# Patient Record
Sex: Male | Born: 1935
Health system: Southern US, Community
[De-identification: ages and names within clinical notes are randomized; demographics above are authoritative.]

## PROBLEM LIST (undated history)

## (undated) DIAGNOSIS — N189 Chronic kidney disease, unspecified: Secondary | ICD-10-CM

## (undated) DIAGNOSIS — I2510 Atherosclerotic heart disease of native coronary artery without angina pectoris: Secondary | ICD-10-CM

## (undated) DIAGNOSIS — J449 Chronic obstructive pulmonary disease, unspecified: Secondary | ICD-10-CM

## (undated) DIAGNOSIS — E7800 Pure hypercholesterolemia, unspecified: Secondary | ICD-10-CM

## (undated) DIAGNOSIS — I10 Essential (primary) hypertension: Secondary | ICD-10-CM

## (undated) HISTORY — DX: Pure hypercholesterolemia, unspecified: E78.00

## (undated) HISTORY — PX: CARDIAC CATHETERIZATION: SHX172

## (undated) HISTORY — DX: Atherosclerotic heart disease of native coronary artery without angina pectoris: I25.10

## (undated) HISTORY — DX: Chronic kidney disease, unspecified: N18.9

## (undated) HISTORY — DX: Chronic obstructive pulmonary disease, unspecified: J44.9

## (undated) HISTORY — DX: Essential (primary) hypertension: I10

---

## 2004-06-06 ENCOUNTER — Encounter (INDEPENDENT_AMBULATORY_CARE_PROVIDER_SITE_OTHER): Payer: Self-pay | Admitting: Family Medicine

## 2004-12-06 ENCOUNTER — Ambulatory Visit: Payer: Self-pay | Admitting: *Deleted

## 2004-12-25 ENCOUNTER — Ambulatory Visit: Payer: Self-pay | Admitting: Family Medicine

## 2004-12-29 ENCOUNTER — Ambulatory Visit (HOSPITAL_COMMUNITY): Admission: RE | Admit: 2004-12-29 | Discharge: 2004-12-29 | Payer: Self-pay | Admitting: Family Medicine

## 2005-01-02 ENCOUNTER — Ambulatory Visit: Payer: Self-pay | Admitting: Family Medicine

## 2005-01-03 ENCOUNTER — Ambulatory Visit: Payer: Self-pay | Admitting: *Deleted

## 2005-01-09 ENCOUNTER — Encounter (INDEPENDENT_AMBULATORY_CARE_PROVIDER_SITE_OTHER): Payer: Self-pay | Admitting: Family Medicine

## 2005-01-09 ENCOUNTER — Ambulatory Visit (HOSPITAL_COMMUNITY): Admission: RE | Admit: 2005-01-09 | Discharge: 2005-01-09 | Payer: Self-pay | Admitting: Family Medicine

## 2005-02-02 ENCOUNTER — Ambulatory Visit: Payer: Self-pay | Admitting: Family Medicine

## 2005-06-25 ENCOUNTER — Ambulatory Visit: Payer: Self-pay | Admitting: Family Medicine

## 2005-08-15 ENCOUNTER — Ambulatory Visit: Payer: Self-pay | Admitting: Family Medicine

## 2005-08-16 ENCOUNTER — Encounter (INDEPENDENT_AMBULATORY_CARE_PROVIDER_SITE_OTHER): Payer: Self-pay | Admitting: Family Medicine

## 2005-09-12 ENCOUNTER — Ambulatory Visit: Payer: Self-pay | Admitting: Family Medicine

## 2005-09-19 ENCOUNTER — Ambulatory Visit: Payer: Self-pay | Admitting: Family Medicine

## 2005-09-21 ENCOUNTER — Inpatient Hospital Stay (HOSPITAL_COMMUNITY): Admission: EM | Admit: 2005-09-21 | Discharge: 2005-09-27 | Payer: Self-pay | Admitting: Emergency Medicine

## 2005-10-04 ENCOUNTER — Ambulatory Visit: Payer: Self-pay | Admitting: Family Medicine

## 2005-10-08 ENCOUNTER — Ambulatory Visit: Payer: Self-pay | Admitting: Family Medicine

## 2005-10-17 ENCOUNTER — Ambulatory Visit: Payer: Self-pay | Admitting: Family Medicine

## 2005-11-14 ENCOUNTER — Ambulatory Visit: Payer: Self-pay | Admitting: Family Medicine

## 2005-12-25 ENCOUNTER — Ambulatory Visit: Payer: Self-pay | Admitting: Family Medicine

## 2005-12-25 LAB — CONVERTED CEMR LAB
RBC count: 5 10*6/uL
WBC, blood: 7.7 10*3/uL

## 2005-12-27 ENCOUNTER — Ambulatory Visit: Payer: Self-pay | Admitting: Family Medicine

## 2006-01-01 ENCOUNTER — Encounter (INDEPENDENT_AMBULATORY_CARE_PROVIDER_SITE_OTHER): Payer: Self-pay | Admitting: Family Medicine

## 2006-01-01 LAB — CONVERTED CEMR LAB: Blood Glucose, Fasting: 99 mg/dL

## 2006-01-04 ENCOUNTER — Encounter (INDEPENDENT_AMBULATORY_CARE_PROVIDER_SITE_OTHER): Payer: Self-pay | Admitting: Family Medicine

## 2006-01-16 ENCOUNTER — Ambulatory Visit: Payer: Self-pay | Admitting: Family Medicine

## 2006-02-01 ENCOUNTER — Encounter: Payer: Self-pay | Admitting: Family Medicine

## 2006-02-01 DIAGNOSIS — M129 Arthropathy, unspecified: Secondary | ICD-10-CM | POA: Insufficient documentation

## 2006-02-01 DIAGNOSIS — F17200 Nicotine dependence, unspecified, uncomplicated: Secondary | ICD-10-CM | POA: Insufficient documentation

## 2006-02-01 DIAGNOSIS — J309 Allergic rhinitis, unspecified: Secondary | ICD-10-CM | POA: Insufficient documentation

## 2006-02-01 DIAGNOSIS — K219 Gastro-esophageal reflux disease without esophagitis: Secondary | ICD-10-CM | POA: Insufficient documentation

## 2006-02-01 DIAGNOSIS — J4489 Other specified chronic obstructive pulmonary disease: Secondary | ICD-10-CM | POA: Insufficient documentation

## 2006-02-01 DIAGNOSIS — J449 Chronic obstructive pulmonary disease, unspecified: Secondary | ICD-10-CM | POA: Insufficient documentation

## 2006-02-01 DIAGNOSIS — H269 Unspecified cataract: Secondary | ICD-10-CM | POA: Insufficient documentation

## 2006-02-01 DIAGNOSIS — E785 Hyperlipidemia, unspecified: Secondary | ICD-10-CM | POA: Insufficient documentation

## 2006-02-01 DIAGNOSIS — I2510 Atherosclerotic heart disease of native coronary artery without angina pectoris: Secondary | ICD-10-CM | POA: Insufficient documentation

## 2006-02-01 DIAGNOSIS — I252 Old myocardial infarction: Secondary | ICD-10-CM | POA: Insufficient documentation

## 2006-02-01 DIAGNOSIS — I10 Essential (primary) hypertension: Secondary | ICD-10-CM | POA: Insufficient documentation

## 2006-02-13 ENCOUNTER — Ambulatory Visit: Payer: Self-pay | Admitting: Family Medicine

## 2006-05-11 ENCOUNTER — Encounter (INDEPENDENT_AMBULATORY_CARE_PROVIDER_SITE_OTHER): Payer: Self-pay | Admitting: Family Medicine

## 2006-05-15 ENCOUNTER — Ambulatory Visit: Payer: Self-pay | Admitting: Family Medicine

## 2006-05-15 ENCOUNTER — Ambulatory Visit (HOSPITAL_COMMUNITY): Admission: RE | Admit: 2006-05-15 | Discharge: 2006-05-15 | Payer: Self-pay | Admitting: Family Medicine

## 2006-05-15 LAB — CONVERTED CEMR LAB
Glucose, Urine, Semiquant: NEGATIVE
Hemoglobin: 14.9 g/dL
Ketones, urine, test strip: NEGATIVE
Nitrite: NEGATIVE
Specific Gravity, Urine: 1.02
Urobilinogen, UA: 1

## 2006-05-16 LAB — CONVERTED CEMR LAB
AST: 14 units/L (ref 0–37)
Albumin: 4.5 g/dL (ref 3.5–5.2)
BUN: 23 mg/dL (ref 6–23)
Basophils Absolute: 0 10*3/uL (ref 0.0–0.1)
Calcium: 9.2 mg/dL (ref 8.4–10.5)
Eosinophils Absolute: 0.2 10*3/uL (ref 0.0–0.7)
Eosinophils Relative: 2 % (ref 0–5)
Glucose, Bld: 128 mg/dL — ABNORMAL HIGH (ref 70–99)
HCT: 45 % (ref 39.0–52.0)
Lymphocytes Relative: 23 % (ref 12–46)
Lymphs Abs: 1.8 10*3/uL (ref 0.7–3.3)
MCHC: 33.8 g/dL (ref 30.0–36.0)
Monocytes Relative: 10 % (ref 3–11)
Platelets: 187 10*3/uL (ref 150–400)
RBC: 5.08 M/uL (ref 4.22–5.81)
TSH: 2.383 microintl units/mL (ref 0.350–5.50)
Total Protein: 6.8 g/dL (ref 6.0–8.3)
WBC: 7.5 10*3/uL (ref 4.0–10.5)

## 2006-05-17 ENCOUNTER — Telehealth (INDEPENDENT_AMBULATORY_CARE_PROVIDER_SITE_OTHER): Payer: Self-pay | Admitting: Family Medicine

## 2006-05-30 ENCOUNTER — Encounter (INDEPENDENT_AMBULATORY_CARE_PROVIDER_SITE_OTHER): Payer: Self-pay | Admitting: Family Medicine

## 2006-06-10 ENCOUNTER — Encounter (INDEPENDENT_AMBULATORY_CARE_PROVIDER_SITE_OTHER): Payer: Self-pay | Admitting: Family Medicine

## 2006-06-24 ENCOUNTER — Encounter (INDEPENDENT_AMBULATORY_CARE_PROVIDER_SITE_OTHER): Payer: Self-pay | Admitting: Family Medicine

## 2006-07-10 ENCOUNTER — Ambulatory Visit: Payer: Self-pay | Admitting: Family Medicine

## 2006-07-10 LAB — CONVERTED CEMR LAB
Cholesterol, target level: 200 mg/dL
HDL goal, serum: 40 mg/dL

## 2006-08-08 ENCOUNTER — Telehealth (INDEPENDENT_AMBULATORY_CARE_PROVIDER_SITE_OTHER): Payer: Self-pay | Admitting: Family Medicine

## 2006-08-12 ENCOUNTER — Encounter (INDEPENDENT_AMBULATORY_CARE_PROVIDER_SITE_OTHER): Payer: Self-pay | Admitting: Family Medicine

## 2006-09-10 ENCOUNTER — Encounter (INDEPENDENT_AMBULATORY_CARE_PROVIDER_SITE_OTHER): Payer: Self-pay | Admitting: Family Medicine

## 2006-10-09 ENCOUNTER — Telehealth (INDEPENDENT_AMBULATORY_CARE_PROVIDER_SITE_OTHER): Payer: Self-pay | Admitting: *Deleted

## 2006-10-09 ENCOUNTER — Ambulatory Visit: Payer: Self-pay | Admitting: Family Medicine

## 2006-10-21 ENCOUNTER — Encounter (INDEPENDENT_AMBULATORY_CARE_PROVIDER_SITE_OTHER): Payer: Self-pay | Admitting: Family Medicine

## 2006-10-22 LAB — CONVERTED CEMR LAB
Cholesterol: 120 mg/dL (ref 0–200)
LDL Cholesterol: 73 mg/dL (ref 0–99)
Total CHOL/HDL Ratio: 3.5
VLDL: 13 mg/dL (ref 0–40)

## 2006-10-23 ENCOUNTER — Ambulatory Visit: Payer: Self-pay | Admitting: Cardiovascular Disease

## 2006-10-23 ENCOUNTER — Encounter (INDEPENDENT_AMBULATORY_CARE_PROVIDER_SITE_OTHER): Payer: Self-pay | Admitting: Family Medicine

## 2006-11-20 ENCOUNTER — Ambulatory Visit: Payer: Self-pay | Admitting: Family Medicine

## 2006-11-25 ENCOUNTER — Telehealth (INDEPENDENT_AMBULATORY_CARE_PROVIDER_SITE_OTHER): Payer: Self-pay | Admitting: Family Medicine

## 2007-01-01 ENCOUNTER — Ambulatory Visit: Payer: Self-pay | Admitting: Family Medicine

## 2007-01-16 ENCOUNTER — Encounter (INDEPENDENT_AMBULATORY_CARE_PROVIDER_SITE_OTHER): Payer: Self-pay | Admitting: Family Medicine

## 2007-01-20 ENCOUNTER — Encounter (INDEPENDENT_AMBULATORY_CARE_PROVIDER_SITE_OTHER): Payer: Self-pay | Admitting: Family Medicine

## 2007-01-20 LAB — CONVERTED CEMR LAB
Albumin: 4.4 g/dL (ref 3.5–5.2)
BUN: 13 mg/dL (ref 6–23)
Basophils Absolute: 0 10*3/uL (ref 0.0–0.1)
Basophils Relative: 0 % (ref 0–1)
CO2: 27 meq/L (ref 19–32)
Chloride: 105 meq/L (ref 96–112)
Cholesterol: 116 mg/dL (ref 0–200)
Eosinophils Absolute: 0.2 10*3/uL (ref 0.0–0.7)
Lymphs Abs: 1.7 10*3/uL (ref 0.7–3.3)
MCHC: 33.5 g/dL (ref 30.0–36.0)
MCV: 90.9 fL (ref 78.0–100.0)
Monocytes Absolute: 0.7 10*3/uL (ref 0.2–0.7)
RDW: 13.3 % (ref 11.5–14.0)
Sodium: 143 meq/L (ref 135–145)
TSH: 1.191 microintl units/mL (ref 0.350–5.50)
Total Bilirubin: 1 mg/dL (ref 0.3–1.2)
Total CHOL/HDL Ratio: 3.4
Total Protein: 6.6 g/dL (ref 6.0–8.3)
Triglycerides: 78 mg/dL (ref <150)

## 2007-01-24 ENCOUNTER — Ambulatory Visit: Payer: Self-pay | Admitting: Family Medicine

## 2007-02-06 ENCOUNTER — Encounter (INDEPENDENT_AMBULATORY_CARE_PROVIDER_SITE_OTHER): Payer: Self-pay | Admitting: Family Medicine

## 2007-03-07 ENCOUNTER — Ambulatory Visit: Payer: Self-pay | Admitting: Family Medicine

## 2007-04-03 ENCOUNTER — Encounter: Payer: Self-pay | Admitting: Family Medicine

## 2007-05-12 ENCOUNTER — Ambulatory Visit: Payer: Self-pay | Admitting: Family Medicine

## 2007-08-11 ENCOUNTER — Ambulatory Visit: Payer: Self-pay | Admitting: Family Medicine

## 2007-08-13 ENCOUNTER — Telehealth (INDEPENDENT_AMBULATORY_CARE_PROVIDER_SITE_OTHER): Payer: Self-pay | Admitting: Family Medicine

## 2007-08-15 LAB — CONVERTED CEMR LAB

## 2007-08-20 ENCOUNTER — Encounter (INDEPENDENT_AMBULATORY_CARE_PROVIDER_SITE_OTHER): Payer: Self-pay | Admitting: Family Medicine

## 2007-08-21 LAB — CONVERTED CEMR LAB
AST: 17 units/L (ref 0–37)
Albumin: 3.9 g/dL (ref 3.5–5.2)
Calcium: 8.8 mg/dL (ref 8.4–10.5)
Chloride: 105 meq/L (ref 96–112)
Creatinine, Ser: 1.05 mg/dL (ref 0.40–1.50)
Glucose, Bld: 110 mg/dL — ABNORMAL HIGH (ref 70–99)
HDL: 25 mg/dL — ABNORMAL LOW (ref >39)
Total Bilirubin: 0.4 mg/dL (ref 0.3–1.2)
Total CHOL/HDL Ratio: 3.8

## 2007-08-26 ENCOUNTER — Ambulatory Visit: Payer: Self-pay | Admitting: Family Medicine

## 2007-11-25 ENCOUNTER — Ambulatory Visit: Payer: Self-pay | Admitting: Family Medicine

## 2007-11-27 ENCOUNTER — Encounter (INDEPENDENT_AMBULATORY_CARE_PROVIDER_SITE_OTHER): Payer: Self-pay | Admitting: Family Medicine

## 2007-12-26 ENCOUNTER — Encounter (INDEPENDENT_AMBULATORY_CARE_PROVIDER_SITE_OTHER): Payer: Self-pay | Admitting: Family Medicine

## 2008-02-24 ENCOUNTER — Ambulatory Visit: Payer: Self-pay | Admitting: Family Medicine

## 2008-03-19 ENCOUNTER — Encounter (INDEPENDENT_AMBULATORY_CARE_PROVIDER_SITE_OTHER): Payer: Self-pay | Admitting: Family Medicine

## 2008-03-22 LAB — CONVERTED CEMR LAB
ALT: <8 units/L (ref 0–53)
Basophils Absolute: 0 10*3/uL (ref 0.0–0.1)
CO2: 24 meq/L (ref 19–32)
Calcium: 9.6 mg/dL (ref 8.4–10.5)
Chloride: 104 meq/L (ref 96–112)
Eosinophils Absolute: 0.2 10*3/uL (ref 0.0–0.7)
Glucose, Bld: 125 mg/dL — ABNORMAL HIGH (ref 70–99)
HCT: 44 % (ref 39.0–52.0)
Hemoglobin: 14.8 g/dL (ref 13.0–17.0)
Lymphocytes Relative: 28 % (ref 12–46)
Lymphs Abs: 1.7 10*3/uL (ref 0.7–4.0)
Neutro Abs: 3.4 10*3/uL (ref 1.7–7.7)
RBC: 4.9 M/uL (ref 4.22–5.81)
TSH: 2.391 microintl units/mL (ref 0.350–4.50)
Total CHOL/HDL Ratio: 3.6
Total Protein: 7.1 g/dL (ref 6.0–8.3)
Triglycerides: 79 mg/dL (ref <150)
VLDL: 16 mg/dL (ref 0–40)

## 2008-04-13 ENCOUNTER — Ambulatory Visit: Payer: Self-pay | Admitting: Family Medicine

## 2008-07-13 ENCOUNTER — Ambulatory Visit: Payer: Self-pay | Admitting: Family Medicine

## 2008-07-13 DIAGNOSIS — M79609 Pain in unspecified limb: Secondary | ICD-10-CM | POA: Insufficient documentation

## 2008-07-15 ENCOUNTER — Encounter (INDEPENDENT_AMBULATORY_CARE_PROVIDER_SITE_OTHER): Payer: Self-pay | Admitting: Family Medicine

## 2008-10-12 ENCOUNTER — Ambulatory Visit: Payer: Self-pay | Admitting: Family Medicine

## 2008-10-12 DIAGNOSIS — IMO0002 Reserved for concepts with insufficient information to code with codable children: Secondary | ICD-10-CM | POA: Insufficient documentation

## 2008-10-22 LAB — CONVERTED CEMR LAB
Alkaline Phosphatase: 51 units/L (ref 39–117)
Basophils Relative: 0 % (ref 0–1)
CO2: 24 meq/L (ref 19–32)
Creatinine, Ser: 1.03 mg/dL (ref 0.40–1.50)
Eosinophils Relative: 2 % (ref 0–5)
HCT: 44.1 % (ref 39.0–52.0)
HDL: 47 mg/dL (ref >39)
Hemoglobin: 14.9 g/dL (ref 13.0–17.0)
Lymphocytes Relative: 28 % (ref 12–46)
Lymphs Abs: 2.5 10*3/uL (ref 0.7–4.0)
MCHC: 33.8 g/dL (ref 30.0–36.0)
Monocytes Relative: 12 % (ref 3–12)
Neutro Abs: 5 10*3/uL (ref 1.7–7.7)
Platelets: 186 10*3/uL (ref 150–400)
Potassium: 4.1 meq/L (ref 3.5–5.3)
RBC: 4.78 M/uL (ref 4.22–5.81)
Sodium: 139 meq/L (ref 135–145)
Total Bilirubin: 0.9 mg/dL (ref 0.3–1.2)
Total CHOL/HDL Ratio: 2.9
VLDL: 11 mg/dL (ref 0–40)
WBC: 8.7 10*3/uL (ref 4.0–10.5)

## 2008-11-23 ENCOUNTER — Ambulatory Visit: Payer: Self-pay | Admitting: Family Medicine

## 2010-05-02 NOTE — Letter (Signed)
Summary: rpc chart  rpc chart   Imported By: Curtis Sites 01/12/2010 15:24:44  _____________________________________________________________________  External Attachment:    Type:   Image     Comment:   External Document

## 2010-08-15 NOTE — Assessment & Plan Note (Signed)
[facility]                        CARDIOLOGY OFFICE NOTE   Ashrith, Sagan SI JACHIM                       MRN:          [id number]  DATE:10/23/2006                            DOB:          [birth year]    Mr. Danny Hill was seen at the request of Dr. Erby Pian. He asked our  opinion in regards to his coronary artery disease and previous IMI.   The patient is quite a Editor, commissioning. He had an inferior wall MI in  [place], [place] back in 2006. At that time, he received a bare-metal  stent to the RCA. He had residual 50% circumflex and 75% LAD disease as  well as 50-70% left main disease. Apparently, the patient was suggested  to have bypass surgery. He saw Dr. Dorethea Clan once and apparently did not  follow up. When I talked to the patient, he was still not interested in  any workup and is clearly not interested in open-heart surgery. He says  when I leave this world, I want to leave it one piece. I am not  interested in the blade.   In talking to the patient, he also continues to smoke a few cigarettes a  day. He is not interested in quitting. He does not want any Chantix or  Wellbutrin.   He is compliant with his medications. He walks on a daily basis. He  denies any chest pain. His initial event actually felt more like being  struck by lightening. It was fairly atypical. He said he has never had  chest pain. He gets some exertional dyspnea due to his smoking. His  review of systems is otherwise negative. He has not had significant  cough, sputum production. There has been no PND, orthopnea. No  palpitations or syncope.   As far as I can tell, his EF back in 2006 was in the 45-50% range with  inferior/posterior akinesis.   I reviewed lab work that was done on the patient on October 21, 2006. At  the time, his LDL was 34. His triglycerides were 67. Total cholesterol  was only 120.   The patient had a hematocrit of 45. He had normal LFTs. Platelet  count  was 187. His TSH was 2.3.   His medications currently include:  1. Fish oil.  2. Flax seed oil.  3. Benazepril 40 a day.  4. Simvastatin 80 a day.  5. Carvedilol 6.25 b.i.d.  6. Omeprazole 20 b.i.d.   Exam is remarkable for an elderly white male in no distress. Affect is  appropriate. Weight is 169. Respiratory rate is 14. Blood pressure is  120/80. Pulse is 64 and regular. Affect is appropriate.  HEENT:  Normal. There is no carotid bruits. No lymphadenopathy. No  thyromegaly. No JVP elevation.  LUNGS:  Were clear with good diaphragmatic motion. No wheezing. There is  a S1/S2 with normal heart sounds. PMI is normal.  ABDOMEN:  Benign. Bowel sounds positive. No AAA. No hepatosplenomegaly  or hepatojugular reflux. No tenderness.  Distal pulses were intact. No edema.  NEUROLOGICAL:  Was nonfocal. There was no muscular weakness.   His baseline EKG shows  sinus rhythm with previous inferior wall MI.   IMPRESSION:  1. Coronary disease. Bare-metal stent to the right coronary artery      with residual left main disease. The patient is currently      asymptomatic. I frankly asked him if he would be willing to have a      stress test and a followup heart catheterization if it was      abnormal. He says he prefers to continue with medical therapy, and      if he gets sick, he will let me know. I told him this had seemed to      work for him for the last two years but may not work indefinitely.      However, the patient clearly does not want an aggressive workup at      this time.  2. Hypercholesterolemia. Currently doing well. Continue statin drug as      well as fish oil.  3. Hypertension. Currently well controlled on benazepril. Also useful      given his mildly decreased LV function and previous myocardial      infarction.  4. Reflux. Continue omeprazole 20 b.i.d.  5. Smoking. The patient is not interested in Wellbutrin or Chantix. He      prefers to smoke a few cigarettes a  day. He will call me if he is      going to try any of these aids. He is not a great candidate for      nicotine replacement.   Overall, I think the patient is fortunate to be doing well, and I will  see him back in six months.     Noralyn Pick. Eden Emms, MD, [facility]  Electronically Signed    PCN/MedQ  DD: 10/23/2006  DT: 10/24/2006  Job #: [id number]   cc:   Erby Pian, M.D.

## 2010-08-18 NOTE — Procedures (Signed)
NAME:  Danny Hill, Danny Hill NO.:  [id number]   MEDICAL RECORD NO.:  [id number]          PATIENT TYPE:  OUT   LOCATION:  RESP                          FACILITY:  [facility]   PHYSICIAN:  Edward L. Juanetta Gosling, M.D.DATE OF BIRTH:  11-Jan-1929   DATE OF PROCEDURE:  DATE OF DISCHARGE:                              PULMONARY FUNCTION TEST   1.  Spirometry shows no definite ventilatory defect but evidence of airflow      obstruction.  2.  Lung volumes are normal.  3.  DLCO is minimally reduced.  4.  Arterial blood gases ae normal.  5.  There is no significant bronchodilator effect. This is consistent with      airflow obstruction in the smaller airways.      Edward L. Juanetta Gosling, M.D.  Electronically Signed     ELH/MEDQ  D:  01/10/2005  T:  01/10/2005  Job:  [id number]

## 2010-08-18 NOTE — Group Therapy Note (Signed)
NAME:  Danny Hill, Danny Hill NO.:  [id number]   MEDICAL RECORD NO.:  [id number]          PATIENT TYPE:  INP   LOCATION:  A225                          FACILITY:  [facility]   PHYSICIAN:  Osvaldo Shipper, MD     DATE OF BIRTH:  [birth year]   DATE OF PROCEDURE:  09/23/2005  DATE OF DISCHARGE:                                   PROGRESS NOTE   SUBJECTIVE:  The patient feels much better.  His pain is much improved.  He  thinks his left leg cellulitis is improving, as well.  He denies any chest  pain or shortness of breath, otherwise is quite comfortable.   OBJECTIVE:  VITAL SIGNS:  Temperature currently 99.1.  His T-max was 100.3  earlier today around midnight.  Blood pressure 107/70, heart rate in the  70s, respiratory rate about 16.  Saturations are not recorded.  LUNGS:  Clear to auscultation bilaterally.  CARDIOVASCULAR:  S1 and S2 normal and regular.  ABDOMEN:  Soft, nontender, nondistended.  Bowel sounds present.  No mass or  organomegaly appreciated.  EXTREMITIES:  The left leg shows slight improvement in the erythema compared  to when I examined this patient at the time of admission.  His left leg does  feel more tense compared to that time but it was somewhat tense yesterday,  as well.  He does have good capillary refill on that side.  He can move his  foot and toes without any difficulty.  According to his girlfriend, his  lesion has improved.  There is no crepitus.  It is still warm to touch,  still quite erythematous.   LABORATORY DATA:  White count today 13, improved from 14.8 yesterday.  His  hemoglobin is 11.7.  MCV 90, platelet count 194, 84% neutrophils.  ESR 40  two to three days ago.  Sodium 133, improved; potassium 3.5, stable;  chloride 106; glucose 174; bicarb 22; BUN improved at 17; creatinine  improved to 1.1.  Blood cultures are all negative.   IMAGING STUDIES:  He did have a CT of the lower extremity which showed  extensive edema throughout the entire  left calf with no fluid collection  suggestive of abscess.  Findings are most compatible with cellulitis.  They  said it was difficult to completely exclude fasciitis as the edema extends  down to the muscle; however, no intramuscular edema of calf or other  abnormalities noted.   ASSESSMENT AND PLAN:  1.  Left leg cellulitis.  I think the diagnosis still is pretty much      cellulitis.  There was no DVT that appeared that venous Dopplers.  CT of      the leg did not show any intramuscular process at this time.  He is      currently on Vancomycin and then Levaquin was added two days ago to      cover for gram negatives which I think is a reasonable idea.  His white      count is improving.  His fevers are getting better.  His symptoms, as  well as his findings are getting better.  I think the patient is just      improving very slowly but he is definitely improving.  CT ruled out any      evidence of fasciitis or any other concern requiring surgery.  I think      he will need at least one or two more days of antibiotics intravenously      and then will need to reevaluate him at that point.  2.  History of coronary artery disease, stable.  3.  Hyponatremia is improved.  4.  Acute renal failure is resolved.  5.  Hypertension, well controlled.  6.  Anemia, also somewhat stable.  Monitor H&H.   DISPOSITION:  He think he needs, as mentioned above, one to two more days of  IV antibiotics when, based on his clinical improvement, disposition can be  decided at that point.      Osvaldo Shipper, MD  Electronically Signed     GK/MEDQ  D:  09/23/2005  T:  09/23/2005  Job:  [id number]

## 2010-08-18 NOTE — H&P (Signed)
NAME:  Danny Hill, PANNING NO.:  [id number]   MEDICAL RECORD NO.:  [id number]          PATIENT TYPE:  EMS   LOCATION:  ED                            FACILITY:  [facility]   PHYSICIAN:  Osvaldo Shipper, MD     DATE OF BIRTH:  [birth year]   DATE OF ADMISSION:  09/20/2005  DATE OF DISCHARGE:  LH                                HISTORY & PHYSICAL   PRIMARY CARE PHYSICIAN:  Dr. Franchot Heidelberg.   ADMITTING DIAGNOSES:  1.  Likely left lower extremity cellulitis.  2.  Rule out deep venous thrombosis.  3.  History of coronary artery disease.  4.  History of hypertension.  5.  Likely history of acid reflux disease.   CHIEF COMPLAINT:  Left leg pain for the past 4 days.   HISTORY OF PRESENTING ILLNESS:  Patient is a 77 year old Caucasian male with  a past medical history of coronary artery disease, hypertension, who was  doing quite well until about 4 days ago when he started noticing pain in his  left leg, and then he examined his leg, he noted it was red in color.  He  experienced severe pain with this redness.  Over the course of the next 2  days, the pain got worse.  His p.o. intake decreased.  He started having  nausea and vomiting.  His temperature today was 97 degrees.  Yesterday, he  went to his PMD who suspected some kind of vasculitic process and obtained  autoimmune labs, as well as did a skin biopsy, all of which are pending at  this time.  He was prescribed prednisone Dosepak to manage his pain and his  symptoms.  However, patient did not feel well over the course of yesterday.  He took his prednisone this morning only; did not take any last night.  His  pain got worse and he decided to come into the ED.  He denies any trauma to  his legs.  He does give history of some spots in the back of his knee, which  have been there for about a week.   MEDICATIONS AT HOME:  1.  Prednisone Dosepak, which he took for the first time this morning.  2.  Atenolol 25 mg once a  day.  3.  Plavix 75 mg once a day.  4.  Lipitor 40 mg daily.  5.  Prevacid 30 mg b.i.d.  6.  Aspirin 81 mg daily.  7.  Norvasc 10 mg daily.  8.  Benazepril 20 mg daily.  9.  He also takes fish oil, flaxseed, and took Tylenol on an as-needed      basis.   ALLERGIES:  NO KNOWN DRUG ALLERGIES.   PAST MEDICAL HISTORY:  History of coronary artery disease with an MI over 9  months ago at [place], [place] for which he had a stent placed, but he  does not know any further details.  That was his first heart attack.  No  history of CABG.  History of dyslipidemia, hypertension for the past many  years.  Possible history of GERD.  No  surgeries in the past.   SOCIAL HISTORY:  Lives with [place] with his girlfriend.  He is a retired Ecologist.  Although he has not traveled any long distance over the past few  weeks.  Smokes currently about 1 cigarette every other day.  Has  approximately 55-pack-year history of smoking.  No alcohol use.  No illicit  drug use.  He does not want a nicotine patch at this time.   FAMILY HISTORY:  Significant for coronary artery disease, open heart  surgeries, breast cancer in sister, hypertension in his brother.   REVIEW OF SYSTEMS:  Ten-point review of systems was unremarkable except as  mentioned in the HPI.  The only thing I would like to add probably is his  very poor p.o. intake and his dry mouth over the past few days.   PHYSICAL EXAMINATION:  VITAL SIGNS:  Temperature 98.4, blood pressure  131/67, heart rate is 73, respiratory rate is 18.  Saturation is not  recorded.  GENERAL EXAM:  He is a well-developed, well-nourished individual actually in  mild discomfort but in no distress.  HEENT:  There is no pallor, no icterus.  Oral mucosa is dry, but no oral  lesions are noted.  NECK:  Soft and supple.  No thyromegaly appreciated.  LUNGS:  Clear to auscultation bilaterally.  CARDIOVASCULAR:  S1, S2 is normal, regular.  No murmurs appreciated, though   heart sounds were somewhat distant.  ABDOMEN:  Soft, nontender, nondistended.  Bowel sounds are noted.  No  organomegaly appreciated.  EXTREMITIES:  No extremity edema was noted.  SKIN EXAMINATION:  There is one area of about 2 cm x 1 cm on his lower  abdomen; it is erythematous; it is slightly scaly.  There are 2 similar  lesions in the back of his left knee joint, was also scaly and appeared to  be healing.  He has 1 lesion on the right leg over his shin area, which also  is scaly and scabby.   The left leg appears quite erythematous, almost hovering on being red in  color.  It is warm to touch.  It is tender to palpation all over.  It  extends pretty much about 3 cm below the knee joint, all the way down to  ankle joint and mostly anteriorly but extending also somewhat posteriorly.  There is some calf tenderness also present.  There is no real area of any  discharge.  There are a few petechiae, which are present.  The area of punch  biopsy is noted to be without any discharge, as well.  Peripheral pulses are  palpable.   LABORATORY DATA:  His white count is 14,500 with 87% neutrophils and no  bands reported yet.  Hemoglobin 14.9, MCV 91, platelet count 168.  His  complete metabolic profile shows sodium 161, potassium is 4.0, chloride 95,  bicarb of 27, glucose 158, BUN is 33, creatinine 1.5, calcium 8.6, albumin  is 3.3.  LFTs are otherwise unremarkable.   X-ray of his left tibia-fibula, which did not show any acute abnormality.   IMPRESSION:  This is a 77 year old Caucasian male with coronary artery  disease, hypertension, dyslipidemia, who presents with pain in his left leg.  The overall picture is suggestive of cellulitis.  I discussed with Dr.  Erby Pian as to his evaluation yesterday when he saw him.  He informed that  the leg was not quite as red as I described to him.  It was not warm to  touch and, hence, he was suspecting more vasculitic process.  Definitely it  is a  possible differential.  However, I think for now we are going to treat  this as a cellulitis.  A deep venous thrombosis also needs to be ruled out,  but it is less likely.  Because of his other skin lesions that he has in his  lower abdomen and over his right leg and behind his left knee, I am also  concerned about methicillin-resistant Staphylococcus aureus here.  Hence, I  think vancomycin would be an appropriate antibiotic for now.  His other  medical problems seem stable.   PLAN:  1.  Left leg cellulitis:  We will obtain blood cultures; start him on      vancomycin.  He has been given Ancef in the ED.  Once we obtain results      of the cultures, antibiotic coverage may be changed.  Pain control will      be with Dilaudid.  Tylenol will be given for fevers, if any. Dr.      Erby Pian informed me that the blood work he sent, which include ANA,      ESR, are all pending.  I think I will just check an ESR in this      hospital.  He also did a skin biopsy, results of which are also pending.  2.  Dehydration:  He has elevated BUN and he is quite dry.  He will be given      gentle hydration.  I am not sure of his ejection fraction at this time.      His nausea and vomiting will be controlled with Zofran and Phenergan as      needed.  Hopefully, his dehydration will be corrected with IV fluids.  3.  His coronary artery disease appears to be stable.  Continue aspirin,      Plavix, beta blocker, and ARB.  4.  Hypertension:  Continuing Norvasc, ARB, and beta blockers for his blood      pressure is reasonable at this time.  If the patient starts spiking      fevers and if he starts becoming septic, we may have to hold back on      these antihypertensive agents.  5.  Hyponatremia:  Likely from dehydration, which should correct with IV      fluids.  6.  DVT and GI prophylaxis will be initiated.   Further management and decision will be based on the results of initial  tests and the patient's  response to treatment.      Osvaldo Shipper, MD  Electronically Signed     GK/MEDQ  D:  09/20/2005  T:  09/20/2005  Job:  [id number]   cc:   Franchot Heidelberg, MD

## 2010-08-18 NOTE — Discharge Summary (Signed)
NAME:  Danny Hill, Danny Hill NO.:  [id number]   MEDICAL RECORD NO.:  [id number]          PATIENT TYPE:  INP   LOCATION:  A225                          FACILITY:  [facility]   PHYSICIAN:  Hanley Hays. Dechurch, M.D.DATE OF BIRTH:  08/28/1928   DATE OF ADMISSION:  09/20/2005  DATE OF DISCHARGE:  06/28/2007LH                                 DISCHARGE SUMMARY   DISCHARGE DIAGNOSES:  1.  Cellulitis/erysipelas of left lower extremity.  2.  History of coronary artery disease.  3.  History of hypertension.  4.  Gastroesophageal reflux disease on Prevacid twice a day.  5.  Status post myocardial infarction/stent treated at [facility] on      aspirin and Plavix.  6.  History of dyslipidemia.  7.  Hypertension.  8.  History of tobacco abuse.   DISPOSITION:  The patient is discharged to home to follow up with Dr.  Pryor Montes in 5-7 days.   WOUND CARE:  Home health for dressing changes to his left lower extremity  wound, Bactroban, Vaseline gauze and dry dressing daily.   DISCHARGE MEDICATIONS:  1.  Atenolol 25 mg daily.  2.  Plavix 75 mg daily.  3.  Lipitor 40 mg daily.  4.  Prevacid 30 mg b.i.d.  5.  Aspirin 81 mg daily.  6.  Norvasc 10 daily.  7.  Benazepril 10 mg daily.   ALLERGIES:  No known drug allergies.   HOSPITAL COURSE:  A 77 year old, active gentleman who developed pain and  swelling in his left lower extremity.  He was seen by his primary care  physician who did a punch biopsy and was given a corticosteroid dose pack.  The pain and edema and erythema markedly worsened and he presented to the  emergency room for further evaluation.  Initial labs revealed a leukocytosis  at 14.5 and normal hemoglobin.  BUN was 33 and creatinine 1.5.  The  sedimentation rate was noted at 40.  Blood cultures remained negative.  A  MRSA screen revealed no evidence of MRSA and wound culture was unremarkable.  He was treated initially with vancomycin and Levaquin.  There was some  reduction in the erythema.  He remained with low-grade fevers.  Unasyn was  added to his regimen and over the course of the next 48 hours, his fever  resolved as did his white count return to baseline.  He was transitioned to  oral Augmentin 875 mg XR b.i.d. and he remained stable.  The open area on  the extremity was treated with Silvadene and there was some increased  erythema raising the question of a local antibiotic reaction.  The Silvadene  was discontinued and he was treated with Bactroban.  On the day of  discharge, the foot reveals 2+ very soft edema.  There is no erythema.  There is some granulation tissue on the medial aspect of the calf and some  denuding, but overall the leg is much improved.  He is complaining of a  little pain.  He has had no more fever and he otherwise has been clinically  stable.  White  count at the time of discharge is 9.9.  His hemoglobin is  11.3 with a hematocrit of 33.  He was hypokalemic during the hospital stay,  but this was supplemented and returned to baseline.  His BUN and creatinine  are normal at the time of discharge.  He had no other current events.  He  remains stable from the standpoint of his vascular disease.  He did have an  ultrasound of the lower extremity which revealed no evidence of DVT.  He  does have a left popliteal cyst which was on incidental finding.  CT of the  lower extremity revealed extensive subcutaneous edema throughout the entire  left calf with no focal collections to suggest abscess most compatible with  cellulitis.  There was no evidence of fasciitis.   The patient is discharged to home with home health to assist with wound care  given his spouses poor eye sight and he will follow up with Dr. Pryor Montes in 5-  7 days for re-evaluation.  No other medication changes were made in his  regimen.  He may benefit from Lotrel which will reduce his pill load which  he tends to complain about during the hospital stay.  He could  not give me  any GI history suggestive of gastroesophageal reflux disease and I suspect  he is on the b.i.d. Prevacid for GI protection and this will be deferred to  his primary care physician.  He is discharged to home in stable condition  with the plan as noted above.      Hanley Hays Josefine Class, M.D.  Electronically Signed     FED/MEDQ  D:  09/27/2005  T:  09/27/2005  Job:  [id number]

## 2015-05-11 DIAGNOSIS — I739 Peripheral vascular disease, unspecified: Secondary | ICD-10-CM | POA: Diagnosis not present

## 2015-05-11 DIAGNOSIS — E782 Mixed hyperlipidemia: Secondary | ICD-10-CM | POA: Diagnosis not present

## 2015-05-11 DIAGNOSIS — F17210 Nicotine dependence, cigarettes, uncomplicated: Secondary | ICD-10-CM | POA: Diagnosis not present

## 2015-05-11 DIAGNOSIS — I10 Essential (primary) hypertension: Secondary | ICD-10-CM | POA: Diagnosis not present

## 2015-05-11 DIAGNOSIS — K210 Gastro-esophageal reflux disease with esophagitis: Secondary | ICD-10-CM | POA: Diagnosis not present

## 2015-05-11 DIAGNOSIS — N182 Chronic kidney disease, stage 2 (mild): Secondary | ICD-10-CM | POA: Diagnosis not present

## 2015-05-11 DIAGNOSIS — Z6822 Body mass index (BMI) 22.0-22.9, adult: Secondary | ICD-10-CM | POA: Diagnosis not present

## 2015-07-27 DIAGNOSIS — H35033 Hypertensive retinopathy, bilateral: Secondary | ICD-10-CM | POA: Diagnosis not present

## 2015-11-08 DIAGNOSIS — I10 Essential (primary) hypertension: Secondary | ICD-10-CM | POA: Diagnosis not present

## 2015-11-08 DIAGNOSIS — E782 Mixed hyperlipidemia: Secondary | ICD-10-CM | POA: Diagnosis not present

## 2015-11-08 DIAGNOSIS — K210 Gastro-esophageal reflux disease with esophagitis: Secondary | ICD-10-CM | POA: Diagnosis not present

## 2015-11-08 DIAGNOSIS — N182 Chronic kidney disease, stage 2 (mild): Secondary | ICD-10-CM | POA: Diagnosis not present

## 2015-11-14 DIAGNOSIS — Z6821 Body mass index (BMI) 21.0-21.9, adult: Secondary | ICD-10-CM | POA: Diagnosis not present

## 2015-11-14 DIAGNOSIS — F17210 Nicotine dependence, cigarettes, uncomplicated: Secondary | ICD-10-CM | POA: Diagnosis not present

## 2015-11-14 DIAGNOSIS — K210 Gastro-esophageal reflux disease with esophagitis: Secondary | ICD-10-CM | POA: Diagnosis not present

## 2015-11-14 DIAGNOSIS — I739 Peripheral vascular disease, unspecified: Secondary | ICD-10-CM | POA: Diagnosis not present

## 2015-11-14 DIAGNOSIS — N182 Chronic kidney disease, stage 2 (mild): Secondary | ICD-10-CM | POA: Diagnosis not present

## 2015-11-14 DIAGNOSIS — Z0001 Encounter for general adult medical examination with abnormal findings: Secondary | ICD-10-CM | POA: Diagnosis not present

## 2015-11-14 DIAGNOSIS — I10 Essential (primary) hypertension: Secondary | ICD-10-CM | POA: Diagnosis not present

## 2016-01-24 DIAGNOSIS — Z6821 Body mass index (BMI) 21.0-21.9, adult: Secondary | ICD-10-CM | POA: Diagnosis not present

## 2016-01-24 DIAGNOSIS — C4492 Squamous cell carcinoma of skin, unspecified: Secondary | ICD-10-CM | POA: Diagnosis not present

## 2016-01-31 DIAGNOSIS — Z85828 Personal history of other malignant neoplasm of skin: Secondary | ICD-10-CM | POA: Diagnosis not present

## 2016-01-31 DIAGNOSIS — C44629 Squamous cell carcinoma of skin of left upper limb, including shoulder: Secondary | ICD-10-CM | POA: Diagnosis not present

## 2016-01-31 DIAGNOSIS — D485 Neoplasm of uncertain behavior of skin: Secondary | ICD-10-CM | POA: Diagnosis not present

## 2016-02-09 DIAGNOSIS — C44629 Squamous cell carcinoma of skin of left upper limb, including shoulder: Secondary | ICD-10-CM | POA: Diagnosis not present

## 2016-04-16 DIAGNOSIS — D0439 Carcinoma in situ of skin of other parts of face: Secondary | ICD-10-CM | POA: Diagnosis not present

## 2016-04-16 DIAGNOSIS — D485 Neoplasm of uncertain behavior of skin: Secondary | ICD-10-CM | POA: Diagnosis not present

## 2016-04-16 DIAGNOSIS — Z85828 Personal history of other malignant neoplasm of skin: Secondary | ICD-10-CM | POA: Diagnosis not present

## 2016-04-16 DIAGNOSIS — L570 Actinic keratosis: Secondary | ICD-10-CM | POA: Diagnosis not present

## 2016-04-26 DIAGNOSIS — C44329 Squamous cell carcinoma of skin of other parts of face: Secondary | ICD-10-CM | POA: Diagnosis not present

## 2016-05-23 DIAGNOSIS — F17210 Nicotine dependence, cigarettes, uncomplicated: Secondary | ICD-10-CM | POA: Diagnosis not present

## 2016-05-23 DIAGNOSIS — Z6821 Body mass index (BMI) 21.0-21.9, adult: Secondary | ICD-10-CM | POA: Diagnosis not present

## 2016-05-23 DIAGNOSIS — R5383 Other fatigue: Secondary | ICD-10-CM | POA: Diagnosis not present

## 2016-05-23 DIAGNOSIS — I739 Peripheral vascular disease, unspecified: Secondary | ICD-10-CM | POA: Diagnosis not present

## 2016-05-23 DIAGNOSIS — I10 Essential (primary) hypertension: Secondary | ICD-10-CM | POA: Diagnosis not present

## 2016-06-06 DIAGNOSIS — N182 Chronic kidney disease, stage 2 (mild): Secondary | ICD-10-CM | POA: Diagnosis not present

## 2016-06-06 DIAGNOSIS — I739 Peripheral vascular disease, unspecified: Secondary | ICD-10-CM | POA: Diagnosis not present

## 2016-06-06 DIAGNOSIS — I10 Essential (primary) hypertension: Secondary | ICD-10-CM | POA: Diagnosis not present

## 2016-06-06 DIAGNOSIS — E782 Mixed hyperlipidemia: Secondary | ICD-10-CM | POA: Diagnosis not present

## 2016-06-25 DIAGNOSIS — J84114 Acute interstitial pneumonitis: Secondary | ICD-10-CM | POA: Diagnosis not present

## 2016-06-25 DIAGNOSIS — R0602 Shortness of breath: Secondary | ICD-10-CM | POA: Diagnosis not present

## 2016-06-27 DIAGNOSIS — Z8679 Personal history of other diseases of the circulatory system: Secondary | ICD-10-CM | POA: Diagnosis not present

## 2016-09-04 DIAGNOSIS — T783XXA Angioneurotic edema, initial encounter: Secondary | ICD-10-CM | POA: Diagnosis not present

## 2016-09-04 DIAGNOSIS — F17200 Nicotine dependence, unspecified, uncomplicated: Secondary | ICD-10-CM | POA: Diagnosis not present

## 2016-09-04 DIAGNOSIS — R220 Localized swelling, mass and lump, head: Secondary | ICD-10-CM | POA: Diagnosis not present

## 2016-09-10 DIAGNOSIS — F17210 Nicotine dependence, cigarettes, uncomplicated: Secondary | ICD-10-CM | POA: Diagnosis not present

## 2016-09-10 DIAGNOSIS — E782 Mixed hyperlipidemia: Secondary | ICD-10-CM | POA: Diagnosis not present

## 2016-09-10 DIAGNOSIS — I10 Essential (primary) hypertension: Secondary | ICD-10-CM | POA: Diagnosis not present

## 2016-09-10 DIAGNOSIS — L508 Other urticaria: Secondary | ICD-10-CM | POA: Diagnosis not present

## 2016-10-22 DIAGNOSIS — L570 Actinic keratosis: Secondary | ICD-10-CM | POA: Diagnosis not present

## 2016-12-04 DIAGNOSIS — K210 Gastro-esophageal reflux disease with esophagitis: Secondary | ICD-10-CM | POA: Diagnosis not present

## 2016-12-04 DIAGNOSIS — F17210 Nicotine dependence, cigarettes, uncomplicated: Secondary | ICD-10-CM | POA: Diagnosis not present

## 2016-12-04 DIAGNOSIS — I10 Essential (primary) hypertension: Secondary | ICD-10-CM | POA: Diagnosis not present

## 2016-12-04 DIAGNOSIS — E782 Mixed hyperlipidemia: Secondary | ICD-10-CM | POA: Diagnosis not present

## 2016-12-06 DIAGNOSIS — I10 Essential (primary) hypertension: Secondary | ICD-10-CM | POA: Diagnosis not present

## 2016-12-06 DIAGNOSIS — E782 Mixed hyperlipidemia: Secondary | ICD-10-CM | POA: Diagnosis not present

## 2016-12-06 DIAGNOSIS — I739 Peripheral vascular disease, unspecified: Secondary | ICD-10-CM | POA: Diagnosis not present

## 2016-12-06 DIAGNOSIS — N182 Chronic kidney disease, stage 2 (mild): Secondary | ICD-10-CM | POA: Diagnosis not present

## 2017-03-21 DIAGNOSIS — E7800 Pure hypercholesterolemia, unspecified: Secondary | ICD-10-CM | POA: Diagnosis not present

## 2017-03-21 DIAGNOSIS — R319 Hematuria, unspecified: Secondary | ICD-10-CM | POA: Diagnosis not present

## 2017-03-21 DIAGNOSIS — J449 Chronic obstructive pulmonary disease, unspecified: Secondary | ICD-10-CM | POA: Diagnosis not present

## 2017-03-21 DIAGNOSIS — R4182 Altered mental status, unspecified: Secondary | ICD-10-CM | POA: Diagnosis not present

## 2017-03-21 DIAGNOSIS — F0150 Vascular dementia without behavioral disturbance: Secondary | ICD-10-CM | POA: Diagnosis not present

## 2017-03-21 DIAGNOSIS — E876 Hypokalemia: Secondary | ICD-10-CM | POA: Diagnosis not present

## 2017-03-21 DIAGNOSIS — I10 Essential (primary) hypertension: Secondary | ICD-10-CM | POA: Diagnosis not present

## 2017-03-21 DIAGNOSIS — F600 Paranoid personality disorder: Secondary | ICD-10-CM | POA: Diagnosis not present

## 2017-03-22 DIAGNOSIS — F0150 Vascular dementia without behavioral disturbance: Secondary | ICD-10-CM | POA: Diagnosis not present

## 2017-03-22 DIAGNOSIS — J449 Chronic obstructive pulmonary disease, unspecified: Secondary | ICD-10-CM | POA: Diagnosis not present

## 2017-03-22 DIAGNOSIS — I10 Essential (primary) hypertension: Secondary | ICD-10-CM | POA: Diagnosis not present

## 2017-03-22 DIAGNOSIS — F17200 Nicotine dependence, unspecified, uncomplicated: Secondary | ICD-10-CM | POA: Diagnosis not present

## 2017-03-22 DIAGNOSIS — R4182 Altered mental status, unspecified: Secondary | ICD-10-CM | POA: Diagnosis not present

## 2017-03-23 DIAGNOSIS — I10 Essential (primary) hypertension: Secondary | ICD-10-CM | POA: Diagnosis not present

## 2017-03-23 DIAGNOSIS — R4182 Altered mental status, unspecified: Secondary | ICD-10-CM | POA: Diagnosis not present

## 2017-03-23 DIAGNOSIS — F0150 Vascular dementia without behavioral disturbance: Secondary | ICD-10-CM | POA: Diagnosis not present

## 2017-03-23 DIAGNOSIS — J449 Chronic obstructive pulmonary disease, unspecified: Secondary | ICD-10-CM | POA: Diagnosis not present

## 2017-03-24 DIAGNOSIS — J449 Chronic obstructive pulmonary disease, unspecified: Secondary | ICD-10-CM | POA: Diagnosis not present

## 2017-03-24 DIAGNOSIS — Z7982 Long term (current) use of aspirin: Secondary | ICD-10-CM | POA: Diagnosis not present

## 2017-03-24 DIAGNOSIS — I69998 Other sequelae following unspecified cerebrovascular disease: Secondary | ICD-10-CM | POA: Diagnosis not present

## 2017-03-24 DIAGNOSIS — Z79899 Other long term (current) drug therapy: Secondary | ICD-10-CM | POA: Diagnosis not present

## 2017-03-24 DIAGNOSIS — I10 Essential (primary) hypertension: Secondary | ICD-10-CM | POA: Diagnosis not present

## 2017-03-24 DIAGNOSIS — E860 Dehydration: Secondary | ICD-10-CM | POA: Diagnosis not present

## 2017-03-24 DIAGNOSIS — F17200 Nicotine dependence, unspecified, uncomplicated: Secondary | ICD-10-CM | POA: Diagnosis not present

## 2017-03-24 DIAGNOSIS — R4182 Altered mental status, unspecified: Secondary | ICD-10-CM | POA: Diagnosis not present

## 2017-03-24 DIAGNOSIS — I679 Cerebrovascular disease, unspecified: Secondary | ICD-10-CM | POA: Diagnosis not present

## 2017-03-24 DIAGNOSIS — E876 Hypokalemia: Secondary | ICD-10-CM | POA: Diagnosis not present

## 2017-03-24 DIAGNOSIS — E7800 Pure hypercholesterolemia, unspecified: Secondary | ICD-10-CM | POA: Diagnosis not present

## 2017-03-24 DIAGNOSIS — F0150 Vascular dementia without behavioral disturbance: Secondary | ICD-10-CM | POA: Diagnosis not present

## 2017-03-24 DIAGNOSIS — K219 Gastro-esophageal reflux disease without esophagitis: Secondary | ICD-10-CM | POA: Diagnosis not present

## 2017-03-25 DIAGNOSIS — F0150 Vascular dementia without behavioral disturbance: Secondary | ICD-10-CM | POA: Diagnosis not present

## 2017-03-25 DIAGNOSIS — I10 Essential (primary) hypertension: Secondary | ICD-10-CM | POA: Diagnosis not present

## 2017-03-26 DIAGNOSIS — R4182 Altered mental status, unspecified: Secondary | ICD-10-CM | POA: Diagnosis not present

## 2017-03-26 DIAGNOSIS — F0150 Vascular dementia without behavioral disturbance: Secondary | ICD-10-CM | POA: Diagnosis not present

## 2017-03-26 DIAGNOSIS — E876 Hypokalemia: Secondary | ICD-10-CM | POA: Diagnosis not present

## 2017-03-26 DIAGNOSIS — I10 Essential (primary) hypertension: Secondary | ICD-10-CM | POA: Diagnosis not present

## 2017-03-27 DIAGNOSIS — I679 Cerebrovascular disease, unspecified: Secondary | ICD-10-CM | POA: Diagnosis not present

## 2017-03-27 DIAGNOSIS — E860 Dehydration: Secondary | ICD-10-CM | POA: Diagnosis not present

## 2017-03-27 DIAGNOSIS — F0150 Vascular dementia without behavioral disturbance: Secondary | ICD-10-CM | POA: Diagnosis not present

## 2017-03-27 DIAGNOSIS — J449 Chronic obstructive pulmonary disease, unspecified: Secondary | ICD-10-CM | POA: Diagnosis not present

## 2017-04-04 DIAGNOSIS — E860 Dehydration: Secondary | ICD-10-CM | POA: Diagnosis not present

## 2017-04-04 DIAGNOSIS — R441 Visual hallucinations: Secondary | ICD-10-CM | POA: Diagnosis not present

## 2017-04-04 DIAGNOSIS — Z6820 Body mass index (BMI) 20.0-20.9, adult: Secondary | ICD-10-CM | POA: Diagnosis not present

## 2017-04-04 DIAGNOSIS — R440 Auditory hallucinations: Secondary | ICD-10-CM | POA: Diagnosis not present

## 2017-04-04 DIAGNOSIS — R4182 Altered mental status, unspecified: Secondary | ICD-10-CM | POA: Diagnosis not present

## 2017-04-26 DIAGNOSIS — R441 Visual hallucinations: Secondary | ICD-10-CM | POA: Diagnosis not present

## 2017-04-26 DIAGNOSIS — R4182 Altered mental status, unspecified: Secondary | ICD-10-CM | POA: Diagnosis not present

## 2017-04-26 DIAGNOSIS — R440 Auditory hallucinations: Secondary | ICD-10-CM | POA: Diagnosis not present

## 2017-04-26 DIAGNOSIS — E860 Dehydration: Secondary | ICD-10-CM | POA: Diagnosis not present

## 2017-05-08 DIAGNOSIS — L570 Actinic keratosis: Secondary | ICD-10-CM | POA: Diagnosis not present

## 2017-06-03 DIAGNOSIS — E860 Dehydration: Secondary | ICD-10-CM | POA: Diagnosis not present

## 2017-06-03 DIAGNOSIS — E782 Mixed hyperlipidemia: Secondary | ICD-10-CM | POA: Diagnosis not present

## 2017-06-03 DIAGNOSIS — I10 Essential (primary) hypertension: Secondary | ICD-10-CM | POA: Diagnosis not present

## 2017-06-03 DIAGNOSIS — F17210 Nicotine dependence, cigarettes, uncomplicated: Secondary | ICD-10-CM | POA: Diagnosis not present

## 2017-06-05 DIAGNOSIS — I2510 Atherosclerotic heart disease of native coronary artery without angina pectoris: Secondary | ICD-10-CM | POA: Diagnosis not present

## 2017-06-05 DIAGNOSIS — J439 Emphysema, unspecified: Secondary | ICD-10-CM | POA: Diagnosis not present

## 2017-06-05 DIAGNOSIS — E782 Mixed hyperlipidemia: Secondary | ICD-10-CM | POA: Diagnosis not present

## 2017-06-05 DIAGNOSIS — F17210 Nicotine dependence, cigarettes, uncomplicated: Secondary | ICD-10-CM | POA: Diagnosis not present

## 2017-08-30 DIAGNOSIS — N184 Chronic kidney disease, stage 4 (severe): Secondary | ICD-10-CM | POA: Diagnosis not present

## 2017-08-30 DIAGNOSIS — I10 Essential (primary) hypertension: Secondary | ICD-10-CM | POA: Diagnosis not present

## 2017-08-30 DIAGNOSIS — K210 Gastro-esophageal reflux disease with esophagitis: Secondary | ICD-10-CM | POA: Diagnosis not present

## 2017-09-03 DIAGNOSIS — F17210 Nicotine dependence, cigarettes, uncomplicated: Secondary | ICD-10-CM | POA: Diagnosis not present

## 2017-09-03 DIAGNOSIS — I2510 Atherosclerotic heart disease of native coronary artery without angina pectoris: Secondary | ICD-10-CM | POA: Diagnosis not present

## 2017-09-03 DIAGNOSIS — Z0001 Encounter for general adult medical examination with abnormal findings: Secondary | ICD-10-CM | POA: Diagnosis not present

## 2017-09-03 DIAGNOSIS — E782 Mixed hyperlipidemia: Secondary | ICD-10-CM | POA: Diagnosis not present

## 2017-09-16 DIAGNOSIS — Z85828 Personal history of other malignant neoplasm of skin: Secondary | ICD-10-CM | POA: Diagnosis not present

## 2017-09-16 DIAGNOSIS — M67441 Ganglion, right hand: Secondary | ICD-10-CM | POA: Diagnosis not present

## 2017-11-05 DIAGNOSIS — L570 Actinic keratosis: Secondary | ICD-10-CM | POA: Diagnosis not present

## 2017-11-05 DIAGNOSIS — L821 Other seborrheic keratosis: Secondary | ICD-10-CM | POA: Diagnosis not present

## 2017-11-05 DIAGNOSIS — Z85828 Personal history of other malignant neoplasm of skin: Secondary | ICD-10-CM | POA: Diagnosis not present

## 2017-12-04 DIAGNOSIS — I10 Essential (primary) hypertension: Secondary | ICD-10-CM | POA: Diagnosis not present

## 2017-12-04 DIAGNOSIS — E782 Mixed hyperlipidemia: Secondary | ICD-10-CM | POA: Diagnosis not present

## 2017-12-04 DIAGNOSIS — N184 Chronic kidney disease, stage 4 (severe): Secondary | ICD-10-CM | POA: Diagnosis not present

## 2017-12-04 DIAGNOSIS — N182 Chronic kidney disease, stage 2 (mild): Secondary | ICD-10-CM | POA: Diagnosis not present

## 2018-02-24 DIAGNOSIS — E782 Mixed hyperlipidemia: Secondary | ICD-10-CM | POA: Diagnosis not present

## 2018-02-24 DIAGNOSIS — I10 Essential (primary) hypertension: Secondary | ICD-10-CM | POA: Diagnosis not present

## 2018-02-24 DIAGNOSIS — K210 Gastro-esophageal reflux disease with esophagitis: Secondary | ICD-10-CM | POA: Diagnosis not present

## 2018-02-24 DIAGNOSIS — F17210 Nicotine dependence, cigarettes, uncomplicated: Secondary | ICD-10-CM | POA: Diagnosis not present

## 2018-02-24 DIAGNOSIS — R5383 Other fatigue: Secondary | ICD-10-CM | POA: Diagnosis not present

## 2018-03-05 DIAGNOSIS — E782 Mixed hyperlipidemia: Secondary | ICD-10-CM | POA: Diagnosis not present

## 2018-03-05 DIAGNOSIS — J439 Emphysema, unspecified: Secondary | ICD-10-CM | POA: Diagnosis not present

## 2018-03-05 DIAGNOSIS — I10 Essential (primary) hypertension: Secondary | ICD-10-CM | POA: Diagnosis not present

## 2018-03-05 DIAGNOSIS — I739 Peripheral vascular disease, unspecified: Secondary | ICD-10-CM | POA: Diagnosis not present

## 2018-05-06 DIAGNOSIS — L821 Other seborrheic keratosis: Secondary | ICD-10-CM | POA: Diagnosis not present

## 2018-05-06 DIAGNOSIS — Z85828 Personal history of other malignant neoplasm of skin: Secondary | ICD-10-CM | POA: Diagnosis not present

## 2018-05-06 DIAGNOSIS — L570 Actinic keratosis: Secondary | ICD-10-CM | POA: Diagnosis not present

## 2018-05-30 DIAGNOSIS — E782 Mixed hyperlipidemia: Secondary | ICD-10-CM | POA: Diagnosis not present

## 2018-05-30 DIAGNOSIS — F17210 Nicotine dependence, cigarettes, uncomplicated: Secondary | ICD-10-CM | POA: Diagnosis not present

## 2018-05-30 DIAGNOSIS — R5383 Other fatigue: Secondary | ICD-10-CM | POA: Diagnosis not present

## 2018-05-30 DIAGNOSIS — I10 Essential (primary) hypertension: Secondary | ICD-10-CM | POA: Diagnosis not present

## 2018-06-04 DIAGNOSIS — N184 Chronic kidney disease, stage 4 (severe): Secondary | ICD-10-CM | POA: Diagnosis not present

## 2018-06-04 DIAGNOSIS — E441 Mild protein-calorie malnutrition: Secondary | ICD-10-CM | POA: Diagnosis not present

## 2018-06-04 DIAGNOSIS — J439 Emphysema, unspecified: Secondary | ICD-10-CM | POA: Diagnosis not present

## 2018-06-04 DIAGNOSIS — I10 Essential (primary) hypertension: Secondary | ICD-10-CM | POA: Diagnosis not present

## 2018-08-29 DIAGNOSIS — K210 Gastro-esophageal reflux disease with esophagitis: Secondary | ICD-10-CM | POA: Diagnosis not present

## 2018-08-29 DIAGNOSIS — D638 Anemia in other chronic diseases classified elsewhere: Secondary | ICD-10-CM | POA: Diagnosis not present

## 2018-08-29 DIAGNOSIS — I10 Essential (primary) hypertension: Secondary | ICD-10-CM | POA: Diagnosis not present

## 2018-08-29 DIAGNOSIS — N184 Chronic kidney disease, stage 4 (severe): Secondary | ICD-10-CM | POA: Diagnosis not present

## 2018-09-03 DIAGNOSIS — I10 Essential (primary) hypertension: Secondary | ICD-10-CM | POA: Diagnosis not present

## 2018-09-03 DIAGNOSIS — N184 Chronic kidney disease, stage 4 (severe): Secondary | ICD-10-CM | POA: Diagnosis not present

## 2018-09-03 DIAGNOSIS — Z0001 Encounter for general adult medical examination with abnormal findings: Secondary | ICD-10-CM | POA: Diagnosis not present

## 2018-09-03 DIAGNOSIS — J439 Emphysema, unspecified: Secondary | ICD-10-CM | POA: Diagnosis not present

## 2018-09-15 DIAGNOSIS — I10 Essential (primary) hypertension: Secondary | ICD-10-CM | POA: Diagnosis not present

## 2018-09-15 DIAGNOSIS — R1310 Dysphagia, unspecified: Secondary | ICD-10-CM | POA: Diagnosis not present

## 2018-09-15 DIAGNOSIS — E441 Mild protein-calorie malnutrition: Secondary | ICD-10-CM | POA: Diagnosis not present

## 2018-09-15 DIAGNOSIS — F17210 Nicotine dependence, cigarettes, uncomplicated: Secondary | ICD-10-CM | POA: Diagnosis not present

## 2018-09-19 ENCOUNTER — Encounter: Payer: Self-pay | Admitting: Gastroenterology

## 2018-09-24 ENCOUNTER — Encounter: Payer: Self-pay | Admitting: Gastroenterology

## 2018-09-24 ENCOUNTER — Other Ambulatory Visit: Payer: Self-pay

## 2018-09-24 ENCOUNTER — Ambulatory Visit (INDEPENDENT_AMBULATORY_CARE_PROVIDER_SITE_OTHER): Payer: Medicare Other | Admitting: Gastroenterology

## 2018-09-24 DIAGNOSIS — R1310 Dysphagia, unspecified: Secondary | ICD-10-CM

## 2018-09-24 NOTE — Assessment & Plan Note (Signed)
85 year old male with new-onset dysphagia several weeks ago, associated weight loss, able to tolerate only soft foods. No odynophagia. No obvious thyromegaly on exam. No prior EGD. Concern for malignancy. I did not appreciate oral thrush on exam. Denies history of chronic GERD. Needs EGD in a timely manner. Discussed continuing with soft foods, sitting upright while eating. As of note, he is edentulous.  Proceed with upper endoscopy/dilatation in the near future with Dr. Oneida Alar. The risks, benefits, and alternatives have been discussed in detail with patient. They have stated understanding and desire to proceed.  Call if worsening symptoms

## 2018-09-24 NOTE — Progress Notes (Signed)
cc'ed to pcp °

## 2018-09-24 NOTE — Patient Instructions (Addendum)
We are arranging you for an upper endoscopy with dilation in the near future with Dr. Oneida Alar.   Continue to stick with soft foods, chew very well, and sit upright for at least 2 hours after eating. Sit upright while eating.  Further recommendations to follow!  It was a pleasure to see you today. I strive to create trusting relationships with patients to provide genuine, compassionate, and quality care. I value your feedback. If you receive a survey regarding your visit,  I greatly appreciate you taking time to fill this out.   Annitta Needs, PhD, ANP-BC [facility]

## 2018-09-24 NOTE — Progress Notes (Signed)
Primary Care Physician:  Curlene Labrum, MD Primary Gastroenterologist:  Dr. Oneida Alar   Chief Complaint  Patient presents with  . Dysphagia    Hurts to talk,Wt loss,Hard to swallow    HPI:   Danny Hill is an 85 y.o. male presenting today at the request of Dr. Pleas Koch due to new onset dysphagia.   Family friend, Kasandra Knudsen, is present. Significantly hard of hearing. New onset dsyphagia about 2 weeks ago. Significantly decreased oral intake now with only soft foods. No abdominal pain. Voice has changed in the past few weeks. Reports weighing 160s in the last few weeks but now 130. Denies NSAIDs. No odynophagia. No melena. NO bright red blood per rectum. Denies history of chronic GERD. No prior EGD.   Past Medical History:  Diagnosis Date  . CAD (coronary artery disease)   . CKD (chronic kidney disease)   . HTN (hypertension)   . Hypercholesteremia     Past Surgical History:  Procedure Laterality Date  . CARDIAC CATHETERIZATION     with stent placement    Current Outpatient Medications  Medication Sig Dispense Refill  . amLODipine (NORVASC) 10 MG tablet Take 10 mg by mouth daily.    Marland Kitchen aspirin EC 81 MG tablet Take 81 mg by mouth daily.    . carvedilol (COREG) 12.5 MG tablet Take 12.5 mg by mouth 2 (two) times daily with a meal.    . Cholecalciferol (D-3-5) 125 MCG (5000 UT) capsule Take 5,000 Units by mouth daily.    . cloNIDine (CATAPRES) 0.1 MG tablet Take 0.1 mg by mouth 2 (two) times daily.    . pravastatin (PRAVACHOL) 40 MG tablet Take 40 mg by mouth daily.     No current facility-administered medications for this visit.     Allergies as of 09/24/2018  . (Not on File)    Family History  Problem Relation Age of Onset  . Colon cancer Neg Hx   . Colon polyps Neg Hx     Social History   Socioeconomic History  . Marital status: Divorced    Spouse name: Not on file  . Number of children: Not on file  . Years of education: Not on file  . Highest education  level: Not on file  Occupational History  . Not on file  Social Needs  . Financial resource strain: Not on file  . Food insecurity    Worry: Not on file    Inability: Not on file  . Transportation needs    Medical: Not on file    Non-medical: Not on file  Tobacco Use  . Smoking status: Current Every Day Smoker    Packs/day: 1.50  . Smokeless tobacco: Never Used  Substance and Sexual Activity  . Alcohol use: Never    Frequency: Never  . Drug use: Never  . Sexual activity: Not on file  Lifestyle  . Physical activity    Days per week: Not on file    Minutes per session: Not on file  . Stress: Not on file  Relationships  . Social Herbalist on phone: Not on file    Gets together: Not on file    Attends religious service: Not on file    Active member of club or organization: Not on file    Attends meetings of clubs or organizations: Not on file    Relationship status: Not on file  . Intimate partner violence    Fear of current or ex  partner: Not on file    Emotionally abused: Not on file    Physically abused: Not on file    Forced sexual activity: Not on file  Other Topics Concern  . Not on file  Social History Narrative  . Not on file    Review of Systems: Gen: see HPI CV: Denies chest pain, heart palpitations, peripheral edema, syncope.  Resp: Denies shortness of breath at rest or with exertion. Denies wheezing or cough.  GI: see HPI GU : Denies urinary burning, urinary frequency, urinary hesitancy MS: Denies joint pain, muscle weakness, cramps, or limitation of movement.  Derm: Denies rash, itching, dry skin Psych: Denies depression, anxiety, memory loss, and confusion Heme: see HPI  Physical Exam: BP (!) 158/98   Pulse 87   Temp (!) 97 F (36.1 C)   Ht _0  (1.803 m)   Wt 130 lb (59 kg)   BMI 18.13 kg/m  General:   Alert and oriented. Pleasant and cooperative. Thin, cachectic-appearing.  Head:  Normocephalic and atraumatic. Eyes:  Without  icterus, sclera clear and conjunctiva pink.  Ears:  Normal auditory acuity. Nose:  No deformity, discharge,  or lesions. Mouth:  Edentulous, no obvious thrush Neck:  Supple, without obvious mass Lungs:  Clear to auscultation bilaterally.  Heart:  S1, S2 present without murmurs appreciated.  Abdomen:  +BS, soft, non-tender and non-distended. Concave stomach. No HSM noted. No guarding or rebound. No masses appreciated.  Rectal:  Deferred  Msk:  Symmetrical without gross deformities. Normal posture. Extremities:  With kyphosis  Neurologic:  Alert and  oriented x4;  grossly normal neurologically. Psych:  Alert and cooperative. Normal mood and affect.  Outside labs May 2020: BUN 40, creatinine 2.85, Tbili 0.6, Alk Phos 56, AST 10, ALT 4, Hgb 12.1, Hct 36.3, normocytic. Platelets 166

## 2018-09-25 ENCOUNTER — Telehealth: Payer: Self-pay | Admitting: Gastroenterology

## 2018-09-25 ENCOUNTER — Other Ambulatory Visit (HOSPITAL_COMMUNITY)
Admission: RE | Admit: 2018-09-25 | Discharge: 2018-09-25 | Disposition: A | Discharge status: Home / Self Care | Payer: Medicare Other | Source: Ambulatory Visit | Attending: Gastroenterology | Admitting: Gastroenterology

## 2018-09-25 ENCOUNTER — Other Ambulatory Visit: Payer: Self-pay

## 2018-09-25 NOTE — Telephone Encounter (Signed)
See prior note. I sent this to AB as an [place]

## 2018-09-25 NOTE — Telephone Encounter (Signed)
Pt was seen in the office yesterday by AB. The caregiver called today and said to cancel everything for Monday with SF because the patient is scared of anesthesia and not waking up.

## 2018-09-25 NOTE — Telephone Encounter (Signed)
LMOVM to call back 

## 2018-09-25 NOTE — Telephone Encounter (Signed)
Zeb Comfort D 14 minutes ago (1:57 PM)     Pt's son called to say that someone had called his mother about patient cancelling his procedure. He said it was no point in calling back and he wasn't going to push it because the patient was near 85 years old. I could not get a word end before the call ended.      ---   Sending to AB as an FYI. Called endo and LMOVM to cancel procedure

## 2018-09-25 NOTE — Telephone Encounter (Signed)
Pt's son called to say that someone had called his mother about patient cancelling his procedure. He said it was no point in calling back and he wasn't going to push it because the patient was near 85 years old. I could not get a word end before the call ended.

## 2018-09-25 NOTE — Telephone Encounter (Signed)
Spoke with Stanton Kidney (pt spouse). She states patient stated he was not coming back here bc he didn't even get to see a doctor. She states he also does not want to have his procedure done bc he has "stents" and is afraid he won't wake up. I advised will cancel if he wishes. She asked for me to call the son Quillian Quince (on dpr) and speak with him before cancelling. Called [phone] and lmtcb

## 2018-09-26 NOTE — Telephone Encounter (Signed)
Noted  

## 2018-09-29 ENCOUNTER — Encounter (HOSPITAL_COMMUNITY): Payer: Self-pay

## 2018-09-29 ENCOUNTER — Ambulatory Visit (HOSPITAL_COMMUNITY): Admit: 2018-09-29 | Payer: Medicare Other | Admitting: Gastroenterology

## 2018-09-29 SURGERY — EGD (ESOPHAGOGASTRODUODENOSCOPY)
Anesthesia: Moderate Sedation

## 2018-10-10 ENCOUNTER — Ambulatory Visit: Payer: Self-pay | Admitting: Gastroenterology

## 2018-10-12 ENCOUNTER — Other Ambulatory Visit: Payer: Self-pay

## 2018-10-12 ENCOUNTER — Encounter (HOSPITAL_COMMUNITY): Payer: Self-pay | Admitting: *Deleted

## 2018-10-12 ENCOUNTER — Emergency Department (HOSPITAL_COMMUNITY): Payer: Medicare Other

## 2018-10-12 ENCOUNTER — Emergency Department (HOSPITAL_COMMUNITY)
Admission: EM | Admit: 2018-10-12 | Discharge: 2018-10-12 | Disposition: A | Discharge status: Home / Self Care | Payer: Medicare Other | Attending: Emergency Medicine | Admitting: Emergency Medicine

## 2018-10-12 DIAGNOSIS — S60221A Contusion of right hand, initial encounter: Secondary | ICD-10-CM | POA: Diagnosis not present

## 2018-10-12 DIAGNOSIS — I129 Hypertensive chronic kidney disease with stage 1 through stage 4 chronic kidney disease, or unspecified chronic kidney disease: Secondary | ICD-10-CM | POA: Diagnosis not present

## 2018-10-12 DIAGNOSIS — E782 Mixed hyperlipidemia: Secondary | ICD-10-CM | POA: Diagnosis not present

## 2018-10-12 DIAGNOSIS — J449 Chronic obstructive pulmonary disease, unspecified: Secondary | ICD-10-CM | POA: Insufficient documentation

## 2018-10-12 DIAGNOSIS — Y939 Activity, unspecified: Secondary | ICD-10-CM | POA: Insufficient documentation

## 2018-10-12 DIAGNOSIS — I2510 Atherosclerotic heart disease of native coronary artery without angina pectoris: Secondary | ICD-10-CM | POA: Diagnosis not present

## 2018-10-12 DIAGNOSIS — M79641 Pain in right hand: Secondary | ICD-10-CM | POA: Diagnosis not present

## 2018-10-12 DIAGNOSIS — Z79899 Other long term (current) drug therapy: Secondary | ICD-10-CM | POA: Diagnosis not present

## 2018-10-12 DIAGNOSIS — I252 Old myocardial infarction: Secondary | ICD-10-CM | POA: Diagnosis not present

## 2018-10-12 DIAGNOSIS — F17210 Nicotine dependence, cigarettes, uncomplicated: Secondary | ICD-10-CM | POA: Insufficient documentation

## 2018-10-12 DIAGNOSIS — N189 Chronic kidney disease, unspecified: Secondary | ICD-10-CM | POA: Insufficient documentation

## 2018-10-12 DIAGNOSIS — W19XXXA Unspecified fall, initial encounter: Secondary | ICD-10-CM | POA: Diagnosis not present

## 2018-10-12 DIAGNOSIS — Y929 Unspecified place or not applicable: Secondary | ICD-10-CM | POA: Insufficient documentation

## 2018-10-12 DIAGNOSIS — Y999 Unspecified external cause status: Secondary | ICD-10-CM | POA: Insufficient documentation

## 2018-10-12 NOTE — ED Provider Notes (Signed)
[facility] EMERGENCY DEPARTMENT Provider Note   CSN: [id number] Arrival date & time: 10/12/18  1402     History   Chief Complaint Chief Complaint  Patient presents with  . Fall    HPI Danny Hill is a 85 y.o. male.     The history is provided by the patient. No language interpreter was used.  Fall This is a new problem. The current episode started 2 days ago. The problem occurs constantly. The problem has not changed since onset.Pertinent negatives include no chest pain. Nothing aggravates the symptoms. Nothing relieves the symptoms. He has tried nothing for the symptoms. The treatment provided no relief.  Pt's family reports he fell on Friday  (2 days ago) and hit hand.  Pt complains of swelling and bruising.   Past Medical History:  Diagnosis Date  . CAD (coronary artery disease)   . CKD (chronic kidney disease)   . COPD (chronic obstructive pulmonary disease) (HCC)   . HTN (hypertension)   . Hypercholesteremia     Patient Active Problem List   Diagnosis Date Noted  . Dysphagia 09/24/2018  . BACK PAIN WITH RADICULOPATHY 10/12/2008  . HAND PAIN, BILATERAL 07/13/2008  . HYPERLIPIDEMIA 02/01/2006  . TOBACCO ABUSE 02/01/2006  . CATARACT NOS 02/01/2006  . HYPERTENSION 02/01/2006  . MYOCARDIAL INFARCTION, HX OF 02/01/2006  . CORONARY ARTERY DISEASE 02/01/2006  . ALLERGIC RHINITIS 02/01/2006  . COPD 02/01/2006  . GERD 02/01/2006  . ARTHRITIS 02/01/2006    Past Surgical History:  Procedure Laterality Date  . CARDIAC CATHETERIZATION     with stent placement        Home Medications    Prior to Admission medications   Medication Sig Start Date End Date Taking? Authorizing Provider  amLODipine (NORVASC) 10 MG tablet Take 10 mg by mouth daily.    [provider]  aspirin EC 81 MG tablet Take 81 mg by mouth daily.    [provider]  carvedilol (COREG) 12.5 MG tablet Take 12.5 mg by mouth 2 (two) times daily with a meal.    [provider]  Cholecalciferol (D-3-5) 125 MCG (5000 UT) capsule Take 5,000 Units by mouth daily.    [provider]  cloNIDine (CATAPRES) 0.1 MG tablet Take 0.1 mg by mouth 2 (two) times daily.    [provider]  pravastatin (PRAVACHOL) 40 MG tablet Take 40 mg by mouth daily.    [provider]    Family History Family History  Problem Relation Age of Onset  . Colon cancer Neg Hx   . Colon polyps Neg Hx     Social History Social History   Tobacco Use  . Smoking status: Current Every Day Smoker    Packs/day: 1.50    Types: Cigarettes  . Smokeless tobacco: Never Used  Substance Use Topics  . Alcohol use: Never    Frequency: Never  . Drug use: Never     Allergies   Patient has no known allergies.   Review of Systems Review of Systems  Cardiovascular: Negative for chest pain.  Musculoskeletal: Positive for arthralgias.  All other systems reviewed and are negative.    Physical Exam Updated Vital Signs BP 128/81 (BP Location: Left Arm)   Pulse 85   Temp (!) 96.4 F (35.8 C) (Temporal)   Resp 20   Ht 5\' 11"  (1.803 m)   Wt 69.9 kg   SpO2 95%   BMI 21.48 kg/m   Physical Exam Vitals signs reviewed.  Musculoskeletal:        General: Swelling and tenderness present.     Comments: Tender right hand, bruised swollen.  From  nv and ns intact   Skin:    General: Skin is warm.  Neurological:     General: No focal deficit present.  Psychiatric:        Mood and Affect: Mood normal.      ED Treatments / Results  Labs (all labs ordered are listed, but only abnormal results are displayed) Labs Reviewed - No data to display  EKG None  Radiology Dg Hand Complete Right  Result Date: 10/12/2018 CLINICAL DATA:  Fall 2 days ago with right hand pain, initial encounter EXAM: RIGHT HAND - COMPLETE 3+ VIEW COMPARISON:  None. FINDINGS: Degenerative changes of the first [facility] joint are noted. 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 Degenerative interphalangeal joint changes are seen. No acute fracture or dislocation is noted. IMPRESSION: Degenerative change without acute abnormality. Electronically Signed   By: Inez Catalina M.D.   On: 10/12/2018 15:13    Procedures Procedures (including critical care time)  Medications Ordered in ED Medications - No data to display   Initial Impression / Assessment and Plan / ED Course  I have reviewed the triage vital signs and the nursing notes.  Pertinent labs & imaging results that were available during my care of the patient were reviewed by me and considered in my medical decision making (see chart for details).        MDM   Xray reviewed and discussed with pt and family.  Pt placed in an ace wrap.   I advised ice and tylenol for pain   Final Clinical Impressions(s) / ED Diagnoses   Final diagnoses:  Contusion of right hand, initial encounter    ED Discharge Orders    None    An After Visit Summary was printed and given to the patient.    Fransico Meadow, [place] 10/12/18 1833    Margette Fast, MD 10/12/18 [phone]

## 2018-10-12 NOTE — ED Triage Notes (Signed)
Pt with fall on Friday and injured right hand.

## 2018-10-12 NOTE — Discharge Instructions (Signed)
Return if any problems.  Tylenol for pain.  See your Physician for recheck in 1 week if pain persist

## 2018-10-27 NOTE — Progress Notes (Signed)
REVIEWED. Pt canceled EGD

## 2018-10-28 DIAGNOSIS — I10 Essential (primary) hypertension: Secondary | ICD-10-CM | POA: Diagnosis not present

## 2018-10-28 DIAGNOSIS — R1310 Dysphagia, unspecified: Secondary | ICD-10-CM | POA: Diagnosis not present

## 2018-10-28 DIAGNOSIS — I639 Cerebral infarction, unspecified: Secondary | ICD-10-CM | POA: Diagnosis not present

## 2018-10-28 DIAGNOSIS — R2681 Unsteadiness on feet: Secondary | ICD-10-CM | POA: Diagnosis not present

## 2018-10-28 DIAGNOSIS — R4781 Slurred speech: Secondary | ICD-10-CM | POA: Diagnosis not present

## 2018-10-28 DIAGNOSIS — Z681 Body mass index (BMI) 19 or less, adult: Secondary | ICD-10-CM | POA: Diagnosis not present

## 2018-10-29 ENCOUNTER — Other Ambulatory Visit: Payer: Self-pay | Admitting: Family Medicine

## 2018-10-29 DIAGNOSIS — R600 Localized edema: Secondary | ICD-10-CM

## 2018-10-31 ENCOUNTER — Ambulatory Visit (HOSPITAL_COMMUNITY)
Admission: RE | Admit: 2018-10-31 | Discharge: 2018-10-31 | Disposition: A | Discharge status: Home / Self Care | Payer: Medicare Other | Source: Ambulatory Visit | Attending: Family Medicine | Admitting: Family Medicine

## 2018-10-31 ENCOUNTER — Other Ambulatory Visit: Payer: Self-pay

## 2018-10-31 DIAGNOSIS — R600 Localized edema: Secondary | ICD-10-CM | POA: Diagnosis not present

## 2018-11-04 DIAGNOSIS — R5383 Other fatigue: Secondary | ICD-10-CM | POA: Diagnosis not present

## 2018-12-31 DIAGNOSIS — I10 Essential (primary) hypertension: Secondary | ICD-10-CM | POA: Diagnosis not present

## 2018-12-31 DIAGNOSIS — E782 Mixed hyperlipidemia: Secondary | ICD-10-CM | POA: Diagnosis not present

## 2019-02-03 DIAGNOSIS — N183 Chronic kidney disease, stage 3 unspecified: Secondary | ICD-10-CM | POA: Diagnosis not present

## 2019-02-03 DIAGNOSIS — I252 Old myocardial infarction: Secondary | ICD-10-CM | POA: Diagnosis not present

## 2019-02-03 DIAGNOSIS — J90 Pleural effusion, not elsewhere classified: Secondary | ICD-10-CM | POA: Diagnosis not present

## 2019-02-03 DIAGNOSIS — N179 Acute kidney failure, unspecified: Secondary | ICD-10-CM | POA: Diagnosis not present

## 2019-02-03 DIAGNOSIS — F17200 Nicotine dependence, unspecified, uncomplicated: Secondary | ICD-10-CM | POA: Diagnosis not present

## 2019-02-03 DIAGNOSIS — R0902 Hypoxemia: Secondary | ICD-10-CM | POA: Diagnosis not present

## 2019-02-03 DIAGNOSIS — I130 Hypertensive heart and chronic kidney disease with heart failure and stage 1 through stage 4 chronic kidney disease, or unspecified chronic kidney disease: Secondary | ICD-10-CM | POA: Diagnosis not present

## 2019-02-03 DIAGNOSIS — R5381 Other malaise: Secondary | ICD-10-CM | POA: Diagnosis not present

## 2019-02-03 DIAGNOSIS — R531 Weakness: Secondary | ICD-10-CM | POA: Diagnosis not present

## 2019-02-03 DIAGNOSIS — Z7982 Long term (current) use of aspirin: Secondary | ICD-10-CM | POA: Diagnosis not present

## 2019-02-03 DIAGNOSIS — I5033 Acute on chronic diastolic (congestive) heart failure: Secondary | ICD-10-CM | POA: Diagnosis not present

## 2019-02-03 DIAGNOSIS — J9601 Acute respiratory failure with hypoxia: Secondary | ICD-10-CM | POA: Diagnosis not present

## 2019-02-03 DIAGNOSIS — H9110 Presbycusis, unspecified ear: Secondary | ICD-10-CM | POA: Diagnosis not present

## 2019-02-03 DIAGNOSIS — I509 Heart failure, unspecified: Secondary | ICD-10-CM | POA: Diagnosis not present

## 2019-02-03 DIAGNOSIS — J189 Pneumonia, unspecified organism: Secondary | ICD-10-CM | POA: Diagnosis not present

## 2019-02-03 DIAGNOSIS — Z23 Encounter for immunization: Secondary | ICD-10-CM | POA: Diagnosis not present

## 2019-02-03 DIAGNOSIS — Z20828 Contact with and (suspected) exposure to other viral communicable diseases: Secondary | ICD-10-CM | POA: Diagnosis not present

## 2019-02-03 DIAGNOSIS — I10 Essential (primary) hypertension: Secondary | ICD-10-CM | POA: Diagnosis not present

## 2019-02-03 DIAGNOSIS — E785 Hyperlipidemia, unspecified: Secondary | ICD-10-CM | POA: Diagnosis not present

## 2019-02-03 DIAGNOSIS — J918 Pleural effusion in other conditions classified elsewhere: Secondary | ICD-10-CM | POA: Diagnosis not present

## 2019-03-08 DIAGNOSIS — J90 Pleural effusion, not elsewhere classified: Secondary | ICD-10-CM | POA: Diagnosis not present

## 2019-03-08 DIAGNOSIS — R54 Age-related physical debility: Secondary | ICD-10-CM | POA: Diagnosis not present

## 2019-03-08 DIAGNOSIS — J189 Pneumonia, unspecified organism: Secondary | ICD-10-CM | POA: Diagnosis not present

## 2019-03-08 DIAGNOSIS — J9601 Acute respiratory failure with hypoxia: Secondary | ICD-10-CM | POA: Diagnosis not present

## 2019-04-07 DIAGNOSIS — R54 Age-related physical debility: Secondary | ICD-10-CM | POA: Diagnosis not present

## 2019-04-08 DIAGNOSIS — J9600 Acute respiratory failure, unspecified whether with hypoxia or hypercapnia: Secondary | ICD-10-CM | POA: Diagnosis not present

## 2019-04-22 DIAGNOSIS — F17210 Nicotine dependence, cigarettes, uncomplicated: Secondary | ICD-10-CM | POA: Diagnosis not present

## 2019-04-22 DIAGNOSIS — D519 Vitamin B12 deficiency anemia, unspecified: Secondary | ICD-10-CM | POA: Diagnosis not present

## 2019-04-22 DIAGNOSIS — D638 Anemia in other chronic diseases classified elsewhere: Secondary | ICD-10-CM | POA: Diagnosis not present

## 2019-04-22 DIAGNOSIS — E782 Mixed hyperlipidemia: Secondary | ICD-10-CM | POA: Diagnosis not present

## 2019-04-22 DIAGNOSIS — E559 Vitamin D deficiency, unspecified: Secondary | ICD-10-CM | POA: Diagnosis not present

## 2019-05-09 DIAGNOSIS — J9600 Acute respiratory failure, unspecified whether with hypoxia or hypercapnia: Secondary | ICD-10-CM | POA: Diagnosis not present

## 2019-06-06 DIAGNOSIS — J9600 Acute respiratory failure, unspecified whether with hypoxia or hypercapnia: Secondary | ICD-10-CM | POA: Diagnosis not present

## 2019-07-01 DIAGNOSIS — E7849 Other hyperlipidemia: Secondary | ICD-10-CM | POA: Diagnosis not present

## 2019-07-01 DIAGNOSIS — I10 Essential (primary) hypertension: Secondary | ICD-10-CM | POA: Diagnosis not present

## 2019-07-01 DIAGNOSIS — M159 Polyosteoarthritis, unspecified: Secondary | ICD-10-CM | POA: Diagnosis not present

## 2019-07-01 DIAGNOSIS — M8589 Other specified disorders of bone density and structure, multiple sites: Secondary | ICD-10-CM | POA: Diagnosis not present

## 2019-07-31 DIAGNOSIS — E7849 Other hyperlipidemia: Secondary | ICD-10-CM | POA: Diagnosis not present

## 2019-07-31 DIAGNOSIS — K219 Gastro-esophageal reflux disease without esophagitis: Secondary | ICD-10-CM | POA: Diagnosis not present

## 2019-08-06 DIAGNOSIS — J9600 Acute respiratory failure, unspecified whether with hypoxia or hypercapnia: Secondary | ICD-10-CM | POA: Diagnosis not present

## 2019-08-31 DIAGNOSIS — N184 Chronic kidney disease, stage 4 (severe): Secondary | ICD-10-CM | POA: Diagnosis not present

## 2019-08-31 DIAGNOSIS — D638 Anemia in other chronic diseases classified elsewhere: Secondary | ICD-10-CM | POA: Diagnosis not present

## 2019-08-31 DIAGNOSIS — E7849 Other hyperlipidemia: Secondary | ICD-10-CM | POA: Diagnosis not present

## 2019-08-31 DIAGNOSIS — I129 Hypertensive chronic kidney disease with stage 1 through stage 4 chronic kidney disease, or unspecified chronic kidney disease: Secondary | ICD-10-CM | POA: Diagnosis not present

## 2019-09-06 DIAGNOSIS — J9600 Acute respiratory failure, unspecified whether with hypoxia or hypercapnia: Secondary | ICD-10-CM | POA: Diagnosis not present

## 2019-10-06 DIAGNOSIS — J9600 Acute respiratory failure, unspecified whether with hypoxia or hypercapnia: Secondary | ICD-10-CM | POA: Diagnosis not present

## 2019-10-30 DIAGNOSIS — N184 Chronic kidney disease, stage 4 (severe): Secondary | ICD-10-CM | POA: Diagnosis not present

## 2019-10-30 DIAGNOSIS — D638 Anemia in other chronic diseases classified elsewhere: Secondary | ICD-10-CM | POA: Diagnosis not present

## 2019-10-30 DIAGNOSIS — I129 Hypertensive chronic kidney disease with stage 1 through stage 4 chronic kidney disease, or unspecified chronic kidney disease: Secondary | ICD-10-CM | POA: Diagnosis not present

## 2019-10-30 DIAGNOSIS — E7849 Other hyperlipidemia: Secondary | ICD-10-CM | POA: Diagnosis not present

## 2019-11-06 DIAGNOSIS — J9600 Acute respiratory failure, unspecified whether with hypoxia or hypercapnia: Secondary | ICD-10-CM | POA: Diagnosis not present

## 2019-12-31 DIAGNOSIS — E7849 Other hyperlipidemia: Secondary | ICD-10-CM | POA: Diagnosis not present

## 2019-12-31 DIAGNOSIS — I129 Hypertensive chronic kidney disease with stage 1 through stage 4 chronic kidney disease, or unspecified chronic kidney disease: Secondary | ICD-10-CM | POA: Diagnosis not present

## 2019-12-31 DIAGNOSIS — D638 Anemia in other chronic diseases classified elsewhere: Secondary | ICD-10-CM | POA: Diagnosis not present

## 2019-12-31 DIAGNOSIS — N184 Chronic kidney disease, stage 4 (severe): Secondary | ICD-10-CM | POA: Diagnosis not present

## 2020-01-30 DIAGNOSIS — N184 Chronic kidney disease, stage 4 (severe): Secondary | ICD-10-CM | POA: Diagnosis not present

## 2020-01-30 DIAGNOSIS — I129 Hypertensive chronic kidney disease with stage 1 through stage 4 chronic kidney disease, or unspecified chronic kidney disease: Secondary | ICD-10-CM | POA: Diagnosis not present

## 2020-01-30 DIAGNOSIS — E7849 Other hyperlipidemia: Secondary | ICD-10-CM | POA: Diagnosis not present

## 2020-03-01 DIAGNOSIS — I129 Hypertensive chronic kidney disease with stage 1 through stage 4 chronic kidney disease, or unspecified chronic kidney disease: Secondary | ICD-10-CM | POA: Diagnosis not present

## 2020-03-01 DIAGNOSIS — E7849 Other hyperlipidemia: Secondary | ICD-10-CM | POA: Diagnosis not present

## 2020-03-01 DIAGNOSIS — N184 Chronic kidney disease, stage 4 (severe): Secondary | ICD-10-CM | POA: Diagnosis not present

## 2020-04-01 DIAGNOSIS — E7849 Other hyperlipidemia: Secondary | ICD-10-CM | POA: Diagnosis not present

## 2020-04-01 DIAGNOSIS — I129 Hypertensive chronic kidney disease with stage 1 through stage 4 chronic kidney disease, or unspecified chronic kidney disease: Secondary | ICD-10-CM | POA: Diagnosis not present

## 2020-04-01 DIAGNOSIS — N184 Chronic kidney disease, stage 4 (severe): Secondary | ICD-10-CM | POA: Diagnosis not present

## 2020-04-01 DIAGNOSIS — D638 Anemia in other chronic diseases classified elsewhere: Secondary | ICD-10-CM | POA: Diagnosis not present

## 2020-06-29 DIAGNOSIS — D638 Anemia in other chronic diseases classified elsewhere: Secondary | ICD-10-CM | POA: Diagnosis not present

## 2020-06-29 DIAGNOSIS — I129 Hypertensive chronic kidney disease with stage 1 through stage 4 chronic kidney disease, or unspecified chronic kidney disease: Secondary | ICD-10-CM | POA: Diagnosis not present

## 2020-06-29 DIAGNOSIS — E7849 Other hyperlipidemia: Secondary | ICD-10-CM | POA: Diagnosis not present

## 2020-06-29 DIAGNOSIS — N184 Chronic kidney disease, stage 4 (severe): Secondary | ICD-10-CM | POA: Diagnosis not present

## 2020-07-30 DIAGNOSIS — E7849 Other hyperlipidemia: Secondary | ICD-10-CM | POA: Diagnosis not present

## 2020-07-30 DIAGNOSIS — N184 Chronic kidney disease, stage 4 (severe): Secondary | ICD-10-CM | POA: Diagnosis not present

## 2020-07-30 DIAGNOSIS — D638 Anemia in other chronic diseases classified elsewhere: Secondary | ICD-10-CM | POA: Diagnosis not present

## 2020-07-30 DIAGNOSIS — I129 Hypertensive chronic kidney disease with stage 1 through stage 4 chronic kidney disease, or unspecified chronic kidney disease: Secondary | ICD-10-CM | POA: Diagnosis not present

## 2020-08-29 DIAGNOSIS — E7849 Other hyperlipidemia: Secondary | ICD-10-CM | POA: Diagnosis not present

## 2020-08-29 DIAGNOSIS — D638 Anemia in other chronic diseases classified elsewhere: Secondary | ICD-10-CM | POA: Diagnosis not present

## 2020-08-29 DIAGNOSIS — I129 Hypertensive chronic kidney disease with stage 1 through stage 4 chronic kidney disease, or unspecified chronic kidney disease: Secondary | ICD-10-CM | POA: Diagnosis not present

## 2020-08-29 DIAGNOSIS — N184 Chronic kidney disease, stage 4 (severe): Secondary | ICD-10-CM | POA: Diagnosis not present

## 2020-09-22 DIAGNOSIS — Z602 Problems related to living alone: Secondary | ICD-10-CM | POA: Diagnosis not present

## 2020-09-22 DIAGNOSIS — I82452 Acute embolism and thrombosis of left peroneal vein: Secondary | ICD-10-CM | POA: Diagnosis not present

## 2020-09-22 DIAGNOSIS — Z66 Do not resuscitate: Secondary | ICD-10-CM | POA: Diagnosis not present

## 2020-09-22 DIAGNOSIS — I739 Peripheral vascular disease, unspecified: Secondary | ICD-10-CM | POA: Diagnosis not present

## 2020-09-22 DIAGNOSIS — R5381 Other malaise: Secondary | ICD-10-CM | POA: Diagnosis not present

## 2020-09-22 DIAGNOSIS — J151 Pneumonia due to Pseudomonas: Secondary | ICD-10-CM | POA: Diagnosis not present

## 2020-09-22 DIAGNOSIS — N184 Chronic kidney disease, stage 4 (severe): Secondary | ICD-10-CM | POA: Diagnosis not present

## 2020-09-22 DIAGNOSIS — R531 Weakness: Secondary | ICD-10-CM | POA: Diagnosis not present

## 2020-09-22 DIAGNOSIS — R4182 Altered mental status, unspecified: Secondary | ICD-10-CM | POA: Diagnosis not present

## 2020-09-22 DIAGNOSIS — I82412 Acute embolism and thrombosis of left femoral vein: Secondary | ICD-10-CM | POA: Diagnosis not present

## 2020-09-22 DIAGNOSIS — E440 Moderate protein-calorie malnutrition: Secondary | ICD-10-CM | POA: Diagnosis not present

## 2020-09-22 DIAGNOSIS — G319 Degenerative disease of nervous system, unspecified: Secondary | ICD-10-CM | POA: Diagnosis not present

## 2020-09-22 DIAGNOSIS — U071 COVID-19: Secondary | ICD-10-CM | POA: Diagnosis not present

## 2020-09-22 DIAGNOSIS — R410 Disorientation, unspecified: Secondary | ICD-10-CM | POA: Diagnosis not present

## 2020-09-22 DIAGNOSIS — I6782 Cerebral ischemia: Secondary | ICD-10-CM | POA: Diagnosis not present

## 2020-09-22 DIAGNOSIS — N179 Acute kidney failure, unspecified: Secondary | ICD-10-CM | POA: Diagnosis not present

## 2020-09-23 DIAGNOSIS — I129 Hypertensive chronic kidney disease with stage 1 through stage 4 chronic kidney disease, or unspecified chronic kidney disease: Secondary | ICD-10-CM | POA: Diagnosis not present

## 2020-09-23 DIAGNOSIS — N184 Chronic kidney disease, stage 4 (severe): Secondary | ICD-10-CM | POA: Diagnosis not present

## 2020-09-23 DIAGNOSIS — K219 Gastro-esophageal reflux disease without esophagitis: Secondary | ICD-10-CM | POA: Diagnosis not present

## 2020-09-23 DIAGNOSIS — R5381 Other malaise: Secondary | ICD-10-CM | POA: Diagnosis not present

## 2020-09-24 DIAGNOSIS — K219 Gastro-esophageal reflux disease without esophagitis: Secondary | ICD-10-CM | POA: Diagnosis not present

## 2020-09-24 DIAGNOSIS — R5381 Other malaise: Secondary | ICD-10-CM | POA: Diagnosis not present

## 2020-09-24 DIAGNOSIS — N184 Chronic kidney disease, stage 4 (severe): Secondary | ICD-10-CM | POA: Diagnosis not present

## 2020-09-24 DIAGNOSIS — I129 Hypertensive chronic kidney disease with stage 1 through stage 4 chronic kidney disease, or unspecified chronic kidney disease: Secondary | ICD-10-CM | POA: Diagnosis not present

## 2020-09-25 DIAGNOSIS — I82412 Acute embolism and thrombosis of left femoral vein: Secondary | ICD-10-CM | POA: Diagnosis not present

## 2020-09-25 DIAGNOSIS — I82452 Acute embolism and thrombosis of left peroneal vein: Secondary | ICD-10-CM | POA: Diagnosis not present

## 2020-09-25 DIAGNOSIS — J151 Pneumonia due to Pseudomonas: Secondary | ICD-10-CM | POA: Diagnosis not present

## 2020-09-25 DIAGNOSIS — E440 Moderate protein-calorie malnutrition: Secondary | ICD-10-CM | POA: Diagnosis not present

## 2020-09-25 DIAGNOSIS — Z66 Do not resuscitate: Secondary | ICD-10-CM | POA: Diagnosis not present

## 2020-09-25 DIAGNOSIS — I739 Peripheral vascular disease, unspecified: Secondary | ICD-10-CM | POA: Diagnosis not present

## 2020-09-25 DIAGNOSIS — N184 Chronic kidney disease, stage 4 (severe): Secondary | ICD-10-CM | POA: Diagnosis not present

## 2020-09-25 DIAGNOSIS — N179 Acute kidney failure, unspecified: Secondary | ICD-10-CM | POA: Diagnosis not present

## 2020-09-25 DIAGNOSIS — I129 Hypertensive chronic kidney disease with stage 1 through stage 4 chronic kidney disease, or unspecified chronic kidney disease: Secondary | ICD-10-CM | POA: Diagnosis not present

## 2020-09-25 DIAGNOSIS — U071 COVID-19: Secondary | ICD-10-CM | POA: Diagnosis not present

## 2020-09-25 DIAGNOSIS — R5381 Other malaise: Secondary | ICD-10-CM | POA: Diagnosis not present

## 2020-09-26 DIAGNOSIS — N179 Acute kidney failure, unspecified: Secondary | ICD-10-CM | POA: Diagnosis not present

## 2020-09-26 DIAGNOSIS — J151 Pneumonia due to Pseudomonas: Secondary | ICD-10-CM | POA: Diagnosis not present

## 2020-09-26 DIAGNOSIS — K219 Gastro-esophageal reflux disease without esophagitis: Secondary | ICD-10-CM | POA: Diagnosis not present

## 2020-09-26 DIAGNOSIS — N184 Chronic kidney disease, stage 4 (severe): Secondary | ICD-10-CM | POA: Diagnosis not present

## 2020-09-26 DIAGNOSIS — R5381 Other malaise: Secondary | ICD-10-CM | POA: Diagnosis not present

## 2020-09-26 DIAGNOSIS — I129 Hypertensive chronic kidney disease with stage 1 through stage 4 chronic kidney disease, or unspecified chronic kidney disease: Secondary | ICD-10-CM | POA: Diagnosis not present

## 2020-09-27 DIAGNOSIS — I129 Hypertensive chronic kidney disease with stage 1 through stage 4 chronic kidney disease, or unspecified chronic kidney disease: Secondary | ICD-10-CM | POA: Diagnosis not present

## 2020-09-27 DIAGNOSIS — N179 Acute kidney failure, unspecified: Secondary | ICD-10-CM | POA: Diagnosis not present

## 2020-09-27 DIAGNOSIS — N184 Chronic kidney disease, stage 4 (severe): Secondary | ICD-10-CM | POA: Diagnosis not present

## 2020-09-27 DIAGNOSIS — R5381 Other malaise: Secondary | ICD-10-CM | POA: Diagnosis not present

## 2020-09-27 DIAGNOSIS — J151 Pneumonia due to Pseudomonas: Secondary | ICD-10-CM | POA: Diagnosis not present

## 2020-09-27 DIAGNOSIS — R609 Edema, unspecified: Secondary | ICD-10-CM | POA: Diagnosis not present

## 2020-09-28 DIAGNOSIS — N179 Acute kidney failure, unspecified: Secondary | ICD-10-CM | POA: Diagnosis not present

## 2020-09-28 DIAGNOSIS — J151 Pneumonia due to Pseudomonas: Secondary | ICD-10-CM | POA: Diagnosis not present

## 2020-09-28 DIAGNOSIS — K219 Gastro-esophageal reflux disease without esophagitis: Secondary | ICD-10-CM | POA: Diagnosis not present

## 2020-09-28 DIAGNOSIS — R5381 Other malaise: Secondary | ICD-10-CM | POA: Diagnosis not present

## 2020-09-28 DIAGNOSIS — I129 Hypertensive chronic kidney disease with stage 1 through stage 4 chronic kidney disease, or unspecified chronic kidney disease: Secondary | ICD-10-CM | POA: Diagnosis not present

## 2020-09-28 DIAGNOSIS — N184 Chronic kidney disease, stage 4 (severe): Secondary | ICD-10-CM | POA: Diagnosis not present

## 2020-09-29 DIAGNOSIS — N179 Acute kidney failure, unspecified: Secondary | ICD-10-CM | POA: Diagnosis not present

## 2020-09-29 DIAGNOSIS — N184 Chronic kidney disease, stage 4 (severe): Secondary | ICD-10-CM | POA: Diagnosis not present

## 2020-09-29 DIAGNOSIS — K219 Gastro-esophageal reflux disease without esophagitis: Secondary | ICD-10-CM | POA: Diagnosis not present

## 2020-09-29 DIAGNOSIS — R5381 Other malaise: Secondary | ICD-10-CM | POA: Diagnosis not present

## 2020-09-29 DIAGNOSIS — I129 Hypertensive chronic kidney disease with stage 1 through stage 4 chronic kidney disease, or unspecified chronic kidney disease: Secondary | ICD-10-CM | POA: Diagnosis not present

## 2020-09-29 DIAGNOSIS — J151 Pneumonia due to Pseudomonas: Secondary | ICD-10-CM | POA: Diagnosis not present

## 2020-09-30 DIAGNOSIS — R5381 Other malaise: Secondary | ICD-10-CM | POA: Diagnosis not present

## 2020-09-30 DIAGNOSIS — N179 Acute kidney failure, unspecified: Secondary | ICD-10-CM | POA: Diagnosis not present

## 2020-09-30 DIAGNOSIS — J151 Pneumonia due to Pseudomonas: Secondary | ICD-10-CM | POA: Diagnosis not present

## 2020-09-30 DIAGNOSIS — N184 Chronic kidney disease, stage 4 (severe): Secondary | ICD-10-CM | POA: Diagnosis not present

## 2020-09-30 DIAGNOSIS — I129 Hypertensive chronic kidney disease with stage 1 through stage 4 chronic kidney disease, or unspecified chronic kidney disease: Secondary | ICD-10-CM | POA: Diagnosis not present

## 2020-09-30 DIAGNOSIS — I739 Peripheral vascular disease, unspecified: Secondary | ICD-10-CM | POA: Diagnosis not present

## 2020-10-01 DIAGNOSIS — N179 Acute kidney failure, unspecified: Secondary | ICD-10-CM | POA: Diagnosis not present

## 2020-10-01 DIAGNOSIS — N184 Chronic kidney disease, stage 4 (severe): Secondary | ICD-10-CM | POA: Diagnosis not present

## 2020-10-01 DIAGNOSIS — J151 Pneumonia due to Pseudomonas: Secondary | ICD-10-CM | POA: Diagnosis not present

## 2020-10-01 DIAGNOSIS — K219 Gastro-esophageal reflux disease without esophagitis: Secondary | ICD-10-CM | POA: Diagnosis not present

## 2020-10-01 DIAGNOSIS — I129 Hypertensive chronic kidney disease with stage 1 through stage 4 chronic kidney disease, or unspecified chronic kidney disease: Secondary | ICD-10-CM | POA: Diagnosis not present

## 2020-10-01 DIAGNOSIS — R5381 Other malaise: Secondary | ICD-10-CM | POA: Diagnosis not present

## 2020-10-02 DIAGNOSIS — I739 Peripheral vascular disease, unspecified: Secondary | ICD-10-CM | POA: Diagnosis not present

## 2020-10-02 DIAGNOSIS — R5381 Other malaise: Secondary | ICD-10-CM | POA: Diagnosis not present

## 2020-10-02 DIAGNOSIS — I129 Hypertensive chronic kidney disease with stage 1 through stage 4 chronic kidney disease, or unspecified chronic kidney disease: Secondary | ICD-10-CM | POA: Diagnosis not present

## 2020-10-02 DIAGNOSIS — J151 Pneumonia due to Pseudomonas: Secondary | ICD-10-CM | POA: Diagnosis not present

## 2020-10-02 DIAGNOSIS — N184 Chronic kidney disease, stage 4 (severe): Secondary | ICD-10-CM | POA: Diagnosis not present

## 2020-10-02 DIAGNOSIS — N179 Acute kidney failure, unspecified: Secondary | ICD-10-CM | POA: Diagnosis not present

## 2020-10-03 DIAGNOSIS — R5381 Other malaise: Secondary | ICD-10-CM | POA: Diagnosis not present

## 2020-10-03 DIAGNOSIS — N179 Acute kidney failure, unspecified: Secondary | ICD-10-CM | POA: Diagnosis not present

## 2020-10-03 DIAGNOSIS — U071 COVID-19: Secondary | ICD-10-CM | POA: Diagnosis not present

## 2020-10-03 DIAGNOSIS — R4182 Altered mental status, unspecified: Secondary | ICD-10-CM | POA: Diagnosis not present

## 2020-10-03 DIAGNOSIS — R509 Fever, unspecified: Secondary | ICD-10-CM | POA: Diagnosis not present

## 2020-10-03 DIAGNOSIS — J151 Pneumonia due to Pseudomonas: Secondary | ICD-10-CM | POA: Diagnosis not present

## 2020-10-03 DIAGNOSIS — I129 Hypertensive chronic kidney disease with stage 1 through stage 4 chronic kidney disease, or unspecified chronic kidney disease: Secondary | ICD-10-CM | POA: Diagnosis not present

## 2020-10-03 DIAGNOSIS — N184 Chronic kidney disease, stage 4 (severe): Secondary | ICD-10-CM | POA: Diagnosis not present

## 2020-10-04 DIAGNOSIS — I129 Hypertensive chronic kidney disease with stage 1 through stage 4 chronic kidney disease, or unspecified chronic kidney disease: Secondary | ICD-10-CM | POA: Diagnosis not present

## 2020-10-04 DIAGNOSIS — I82452 Acute embolism and thrombosis of left peroneal vein: Secondary | ICD-10-CM | POA: Diagnosis not present

## 2020-10-04 DIAGNOSIS — N184 Chronic kidney disease, stage 4 (severe): Secondary | ICD-10-CM | POA: Diagnosis not present

## 2020-10-04 DIAGNOSIS — I82412 Acute embolism and thrombosis of left femoral vein: Secondary | ICD-10-CM | POA: Diagnosis not present

## 2020-10-04 DIAGNOSIS — J151 Pneumonia due to Pseudomonas: Secondary | ICD-10-CM | POA: Diagnosis not present

## 2020-10-04 DIAGNOSIS — E440 Moderate protein-calorie malnutrition: Secondary | ICD-10-CM | POA: Diagnosis not present

## 2020-10-04 DIAGNOSIS — I739 Peripheral vascular disease, unspecified: Secondary | ICD-10-CM | POA: Diagnosis not present

## 2020-10-04 DIAGNOSIS — N179 Acute kidney failure, unspecified: Secondary | ICD-10-CM | POA: Diagnosis not present

## 2020-10-04 DIAGNOSIS — U071 COVID-19: Secondary | ICD-10-CM | POA: Diagnosis not present

## 2020-10-04 DIAGNOSIS — R5381 Other malaise: Secondary | ICD-10-CM | POA: Diagnosis not present

## 2020-10-04 DIAGNOSIS — Z66 Do not resuscitate: Secondary | ICD-10-CM | POA: Diagnosis not present

## 2020-10-05 DIAGNOSIS — I82452 Acute embolism and thrombosis of left peroneal vein: Secondary | ICD-10-CM | POA: Diagnosis not present

## 2020-10-05 DIAGNOSIS — J151 Pneumonia due to Pseudomonas: Secondary | ICD-10-CM | POA: Diagnosis not present

## 2020-10-05 DIAGNOSIS — N390 Urinary tract infection, site not specified: Secondary | ICD-10-CM | POA: Diagnosis not present

## 2020-10-05 DIAGNOSIS — Z66 Do not resuscitate: Secondary | ICD-10-CM | POA: Diagnosis not present

## 2020-10-05 DIAGNOSIS — E440 Moderate protein-calorie malnutrition: Secondary | ICD-10-CM | POA: Diagnosis not present

## 2020-10-05 DIAGNOSIS — U071 COVID-19: Secondary | ICD-10-CM | POA: Diagnosis not present

## 2020-10-05 DIAGNOSIS — R5381 Other malaise: Secondary | ICD-10-CM | POA: Diagnosis not present

## 2020-10-05 DIAGNOSIS — I739 Peripheral vascular disease, unspecified: Secondary | ICD-10-CM | POA: Diagnosis not present

## 2020-10-05 DIAGNOSIS — I129 Hypertensive chronic kidney disease with stage 1 through stage 4 chronic kidney disease, or unspecified chronic kidney disease: Secondary | ICD-10-CM | POA: Diagnosis not present

## 2020-10-05 DIAGNOSIS — N179 Acute kidney failure, unspecified: Secondary | ICD-10-CM | POA: Diagnosis not present

## 2020-10-05 DIAGNOSIS — I82412 Acute embolism and thrombosis of left femoral vein: Secondary | ICD-10-CM | POA: Diagnosis not present

## 2020-10-05 DIAGNOSIS — N184 Chronic kidney disease, stage 4 (severe): Secondary | ICD-10-CM | POA: Diagnosis not present

## 2020-10-06 DIAGNOSIS — N390 Urinary tract infection, site not specified: Secondary | ICD-10-CM | POA: Diagnosis not present

## 2020-10-06 DIAGNOSIS — I82412 Acute embolism and thrombosis of left femoral vein: Secondary | ICD-10-CM | POA: Diagnosis not present

## 2020-10-06 DIAGNOSIS — U071 COVID-19: Secondary | ICD-10-CM | POA: Diagnosis not present

## 2020-10-06 DIAGNOSIS — Z66 Do not resuscitate: Secondary | ICD-10-CM | POA: Diagnosis not present

## 2020-10-06 DIAGNOSIS — N184 Chronic kidney disease, stage 4 (severe): Secondary | ICD-10-CM | POA: Diagnosis not present

## 2020-10-06 DIAGNOSIS — B9689 Other specified bacterial agents as the cause of diseases classified elsewhere: Secondary | ICD-10-CM | POA: Diagnosis not present

## 2020-10-06 DIAGNOSIS — R5381 Other malaise: Secondary | ICD-10-CM | POA: Diagnosis not present

## 2020-10-06 DIAGNOSIS — I739 Peripheral vascular disease, unspecified: Secondary | ICD-10-CM | POA: Diagnosis not present

## 2020-10-06 DIAGNOSIS — E440 Moderate protein-calorie malnutrition: Secondary | ICD-10-CM | POA: Diagnosis not present

## 2020-10-06 DIAGNOSIS — I82452 Acute embolism and thrombosis of left peroneal vein: Secondary | ICD-10-CM | POA: Diagnosis not present

## 2020-10-06 DIAGNOSIS — J151 Pneumonia due to Pseudomonas: Secondary | ICD-10-CM | POA: Diagnosis not present

## 2020-10-06 DIAGNOSIS — N179 Acute kidney failure, unspecified: Secondary | ICD-10-CM | POA: Diagnosis not present

## 2020-10-07 DIAGNOSIS — U071 COVID-19: Secondary | ICD-10-CM | POA: Diagnosis not present

## 2020-10-07 DIAGNOSIS — I82412 Acute embolism and thrombosis of left femoral vein: Secondary | ICD-10-CM | POA: Diagnosis not present

## 2020-10-07 DIAGNOSIS — E440 Moderate protein-calorie malnutrition: Secondary | ICD-10-CM | POA: Diagnosis not present

## 2020-10-07 DIAGNOSIS — Z66 Do not resuscitate: Secondary | ICD-10-CM | POA: Diagnosis not present

## 2020-10-07 DIAGNOSIS — J151 Pneumonia due to Pseudomonas: Secondary | ICD-10-CM | POA: Diagnosis not present

## 2020-10-07 DIAGNOSIS — R5381 Other malaise: Secondary | ICD-10-CM | POA: Diagnosis not present

## 2020-10-07 DIAGNOSIS — I824Z2 Acute embolism and thrombosis of unspecified deep veins of left distal lower extremity: Secondary | ICD-10-CM | POA: Diagnosis not present

## 2020-10-07 DIAGNOSIS — I739 Peripheral vascular disease, unspecified: Secondary | ICD-10-CM | POA: Diagnosis not present

## 2020-10-07 DIAGNOSIS — N179 Acute kidney failure, unspecified: Secondary | ICD-10-CM | POA: Diagnosis not present

## 2020-10-07 DIAGNOSIS — I82452 Acute embolism and thrombosis of left peroneal vein: Secondary | ICD-10-CM | POA: Diagnosis not present

## 2020-10-07 DIAGNOSIS — I129 Hypertensive chronic kidney disease with stage 1 through stage 4 chronic kidney disease, or unspecified chronic kidney disease: Secondary | ICD-10-CM | POA: Diagnosis not present

## 2020-10-07 DIAGNOSIS — M7989 Other specified soft tissue disorders: Secondary | ICD-10-CM | POA: Diagnosis not present

## 2020-10-07 DIAGNOSIS — N184 Chronic kidney disease, stage 4 (severe): Secondary | ICD-10-CM | POA: Diagnosis not present

## 2020-10-08 DIAGNOSIS — I739 Peripheral vascular disease, unspecified: Secondary | ICD-10-CM | POA: Diagnosis not present

## 2020-10-08 DIAGNOSIS — N184 Chronic kidney disease, stage 4 (severe): Secondary | ICD-10-CM | POA: Diagnosis not present

## 2020-10-08 DIAGNOSIS — U071 COVID-19: Secondary | ICD-10-CM | POA: Diagnosis not present

## 2020-10-08 DIAGNOSIS — I82452 Acute embolism and thrombosis of left peroneal vein: Secondary | ICD-10-CM | POA: Diagnosis not present

## 2020-10-08 DIAGNOSIS — N179 Acute kidney failure, unspecified: Secondary | ICD-10-CM | POA: Diagnosis not present

## 2020-10-08 DIAGNOSIS — J151 Pneumonia due to Pseudomonas: Secondary | ICD-10-CM | POA: Diagnosis not present

## 2020-10-08 DIAGNOSIS — R5381 Other malaise: Secondary | ICD-10-CM | POA: Diagnosis not present

## 2020-10-08 DIAGNOSIS — Z66 Do not resuscitate: Secondary | ICD-10-CM | POA: Diagnosis not present

## 2020-10-08 DIAGNOSIS — I129 Hypertensive chronic kidney disease with stage 1 through stage 4 chronic kidney disease, or unspecified chronic kidney disease: Secondary | ICD-10-CM | POA: Diagnosis not present

## 2020-10-08 DIAGNOSIS — I82412 Acute embolism and thrombosis of left femoral vein: Secondary | ICD-10-CM | POA: Diagnosis not present

## 2020-10-08 DIAGNOSIS — E440 Moderate protein-calorie malnutrition: Secondary | ICD-10-CM | POA: Diagnosis not present

## 2020-10-09 DIAGNOSIS — U071 COVID-19: Secondary | ICD-10-CM | POA: Diagnosis not present

## 2020-10-09 DIAGNOSIS — R059 Cough, unspecified: Secondary | ICD-10-CM | POA: Diagnosis not present

## 2020-10-09 DIAGNOSIS — R5381 Other malaise: Secondary | ICD-10-CM | POA: Diagnosis not present

## 2020-10-09 DIAGNOSIS — J151 Pneumonia due to Pseudomonas: Secondary | ICD-10-CM | POA: Diagnosis not present

## 2020-10-09 DIAGNOSIS — N179 Acute kidney failure, unspecified: Secondary | ICD-10-CM | POA: Diagnosis not present

## 2020-10-09 DIAGNOSIS — I129 Hypertensive chronic kidney disease with stage 1 through stage 4 chronic kidney disease, or unspecified chronic kidney disease: Secondary | ICD-10-CM | POA: Diagnosis not present

## 2020-10-09 DIAGNOSIS — N184 Chronic kidney disease, stage 4 (severe): Secondary | ICD-10-CM | POA: Diagnosis not present

## 2020-10-10 DIAGNOSIS — I129 Hypertensive chronic kidney disease with stage 1 through stage 4 chronic kidney disease, or unspecified chronic kidney disease: Secondary | ICD-10-CM | POA: Diagnosis not present

## 2020-10-10 DIAGNOSIS — J151 Pneumonia due to Pseudomonas: Secondary | ICD-10-CM | POA: Diagnosis not present

## 2020-10-10 DIAGNOSIS — N179 Acute kidney failure, unspecified: Secondary | ICD-10-CM | POA: Diagnosis not present

## 2020-10-10 DIAGNOSIS — R5381 Other malaise: Secondary | ICD-10-CM | POA: Diagnosis not present

## 2020-10-10 DIAGNOSIS — U071 COVID-19: Secondary | ICD-10-CM | POA: Diagnosis not present

## 2020-10-10 DIAGNOSIS — N184 Chronic kidney disease, stage 4 (severe): Secondary | ICD-10-CM | POA: Diagnosis not present

## 2020-10-11 DIAGNOSIS — N179 Acute kidney failure, unspecified: Secondary | ICD-10-CM | POA: Diagnosis not present

## 2020-10-11 DIAGNOSIS — I129 Hypertensive chronic kidney disease with stage 1 through stage 4 chronic kidney disease, or unspecified chronic kidney disease: Secondary | ICD-10-CM | POA: Diagnosis not present

## 2020-10-11 DIAGNOSIS — R5381 Other malaise: Secondary | ICD-10-CM | POA: Diagnosis not present

## 2020-10-11 DIAGNOSIS — U071 COVID-19: Secondary | ICD-10-CM | POA: Diagnosis not present

## 2020-10-11 DIAGNOSIS — N184 Chronic kidney disease, stage 4 (severe): Secondary | ICD-10-CM | POA: Diagnosis not present

## 2020-10-11 DIAGNOSIS — J151 Pneumonia due to Pseudomonas: Secondary | ICD-10-CM | POA: Diagnosis not present

## 2020-10-12 DIAGNOSIS — R5381 Other malaise: Secondary | ICD-10-CM | POA: Diagnosis not present

## 2020-10-12 DIAGNOSIS — J151 Pneumonia due to Pseudomonas: Secondary | ICD-10-CM | POA: Diagnosis not present

## 2020-10-12 DIAGNOSIS — I129 Hypertensive chronic kidney disease with stage 1 through stage 4 chronic kidney disease, or unspecified chronic kidney disease: Secondary | ICD-10-CM | POA: Diagnosis not present

## 2020-10-12 DIAGNOSIS — U071 COVID-19: Secondary | ICD-10-CM | POA: Diagnosis not present

## 2020-10-12 DIAGNOSIS — N179 Acute kidney failure, unspecified: Secondary | ICD-10-CM | POA: Diagnosis not present

## 2020-10-12 DIAGNOSIS — N184 Chronic kidney disease, stage 4 (severe): Secondary | ICD-10-CM | POA: Diagnosis not present

## 2020-10-13 DIAGNOSIS — J151 Pneumonia due to Pseudomonas: Secondary | ICD-10-CM | POA: Diagnosis not present

## 2020-10-13 DIAGNOSIS — I129 Hypertensive chronic kidney disease with stage 1 through stage 4 chronic kidney disease, or unspecified chronic kidney disease: Secondary | ICD-10-CM | POA: Diagnosis not present

## 2020-10-13 DIAGNOSIS — U071 COVID-19: Secondary | ICD-10-CM | POA: Diagnosis not present

## 2020-10-13 DIAGNOSIS — N179 Acute kidney failure, unspecified: Secondary | ICD-10-CM | POA: Diagnosis not present

## 2020-10-13 DIAGNOSIS — N184 Chronic kidney disease, stage 4 (severe): Secondary | ICD-10-CM | POA: Diagnosis not present

## 2020-10-13 DIAGNOSIS — R5381 Other malaise: Secondary | ICD-10-CM | POA: Diagnosis not present

## 2020-10-14 DIAGNOSIS — J151 Pneumonia due to Pseudomonas: Secondary | ICD-10-CM | POA: Diagnosis not present

## 2020-10-14 DIAGNOSIS — R5381 Other malaise: Secondary | ICD-10-CM | POA: Diagnosis not present

## 2020-10-14 DIAGNOSIS — I129 Hypertensive chronic kidney disease with stage 1 through stage 4 chronic kidney disease, or unspecified chronic kidney disease: Secondary | ICD-10-CM | POA: Diagnosis not present

## 2020-10-14 DIAGNOSIS — U071 COVID-19: Secondary | ICD-10-CM | POA: Diagnosis not present

## 2020-10-15 DIAGNOSIS — U071 COVID-19: Secondary | ICD-10-CM | POA: Diagnosis not present

## 2020-10-15 DIAGNOSIS — N179 Acute kidney failure, unspecified: Secondary | ICD-10-CM | POA: Diagnosis not present

## 2020-10-15 DIAGNOSIS — N184 Chronic kidney disease, stage 4 (severe): Secondary | ICD-10-CM | POA: Diagnosis not present

## 2020-10-15 DIAGNOSIS — I129 Hypertensive chronic kidney disease with stage 1 through stage 4 chronic kidney disease, or unspecified chronic kidney disease: Secondary | ICD-10-CM | POA: Diagnosis not present

## 2020-10-15 DIAGNOSIS — J151 Pneumonia due to Pseudomonas: Secondary | ICD-10-CM | POA: Diagnosis not present

## 2020-10-15 DIAGNOSIS — R5381 Other malaise: Secondary | ICD-10-CM | POA: Diagnosis not present

## 2020-10-16 DIAGNOSIS — R339 Retention of urine, unspecified: Secondary | ICD-10-CM | POA: Diagnosis not present

## 2020-10-16 DIAGNOSIS — R5381 Other malaise: Secondary | ICD-10-CM | POA: Diagnosis not present

## 2020-10-16 DIAGNOSIS — N184 Chronic kidney disease, stage 4 (severe): Secondary | ICD-10-CM | POA: Diagnosis not present

## 2020-10-16 DIAGNOSIS — I129 Hypertensive chronic kidney disease with stage 1 through stage 4 chronic kidney disease, or unspecified chronic kidney disease: Secondary | ICD-10-CM | POA: Diagnosis not present

## 2020-10-17 DIAGNOSIS — J151 Pneumonia due to Pseudomonas: Secondary | ICD-10-CM | POA: Diagnosis not present

## 2020-10-17 DIAGNOSIS — R5381 Other malaise: Secondary | ICD-10-CM | POA: Diagnosis not present

## 2020-10-17 DIAGNOSIS — N184 Chronic kidney disease, stage 4 (severe): Secondary | ICD-10-CM | POA: Diagnosis not present

## 2020-10-17 DIAGNOSIS — I129 Hypertensive chronic kidney disease with stage 1 through stage 4 chronic kidney disease, or unspecified chronic kidney disease: Secondary | ICD-10-CM | POA: Diagnosis not present

## 2020-10-18 DIAGNOSIS — R5381 Other malaise: Secondary | ICD-10-CM | POA: Diagnosis not present

## 2020-10-18 DIAGNOSIS — N184 Chronic kidney disease, stage 4 (severe): Secondary | ICD-10-CM | POA: Diagnosis not present

## 2020-10-18 DIAGNOSIS — J151 Pneumonia due to Pseudomonas: Secondary | ICD-10-CM | POA: Diagnosis not present

## 2020-10-18 DIAGNOSIS — I129 Hypertensive chronic kidney disease with stage 1 through stage 4 chronic kidney disease, or unspecified chronic kidney disease: Secondary | ICD-10-CM | POA: Diagnosis not present

## 2020-10-19 DIAGNOSIS — I82411 Acute embolism and thrombosis of right femoral vein: Secondary | ICD-10-CM | POA: Diagnosis not present

## 2020-10-19 DIAGNOSIS — M7989 Other specified soft tissue disorders: Secondary | ICD-10-CM | POA: Diagnosis not present

## 2020-10-19 DIAGNOSIS — I82811 Embolism and thrombosis of superficial veins of right lower extremities: Secondary | ICD-10-CM | POA: Diagnosis not present

## 2020-10-19 DIAGNOSIS — N184 Chronic kidney disease, stage 4 (severe): Secondary | ICD-10-CM | POA: Diagnosis not present

## 2020-10-19 DIAGNOSIS — R5381 Other malaise: Secondary | ICD-10-CM | POA: Diagnosis not present

## 2020-10-19 DIAGNOSIS — J151 Pneumonia due to Pseudomonas: Secondary | ICD-10-CM | POA: Diagnosis not present

## 2020-10-19 DIAGNOSIS — I129 Hypertensive chronic kidney disease with stage 1 through stage 4 chronic kidney disease, or unspecified chronic kidney disease: Secondary | ICD-10-CM | POA: Diagnosis not present

## 2020-10-21 DIAGNOSIS — J151 Pneumonia due to Pseudomonas: Secondary | ICD-10-CM | POA: Diagnosis not present

## 2020-10-21 DIAGNOSIS — R5381 Other malaise: Secondary | ICD-10-CM | POA: Diagnosis not present

## 2020-10-21 DIAGNOSIS — N184 Chronic kidney disease, stage 4 (severe): Secondary | ICD-10-CM | POA: Diagnosis not present

## 2020-10-21 DIAGNOSIS — E440 Moderate protein-calorie malnutrition: Secondary | ICD-10-CM | POA: Diagnosis not present

## 2020-10-21 DIAGNOSIS — N179 Acute kidney failure, unspecified: Secondary | ICD-10-CM | POA: Diagnosis not present

## 2020-10-21 DIAGNOSIS — U071 COVID-19: Secondary | ICD-10-CM | POA: Diagnosis not present

## 2020-10-21 DIAGNOSIS — I129 Hypertensive chronic kidney disease with stage 1 through stage 4 chronic kidney disease, or unspecified chronic kidney disease: Secondary | ICD-10-CM | POA: Diagnosis not present

## 2020-10-22 DIAGNOSIS — N184 Chronic kidney disease, stage 4 (severe): Secondary | ICD-10-CM | POA: Diagnosis not present

## 2020-10-22 DIAGNOSIS — I129 Hypertensive chronic kidney disease with stage 1 through stage 4 chronic kidney disease, or unspecified chronic kidney disease: Secondary | ICD-10-CM | POA: Diagnosis not present

## 2020-10-22 DIAGNOSIS — R5381 Other malaise: Secondary | ICD-10-CM | POA: Diagnosis not present

## 2020-10-22 DIAGNOSIS — N179 Acute kidney failure, unspecified: Secondary | ICD-10-CM | POA: Diagnosis not present

## 2020-10-22 DIAGNOSIS — J151 Pneumonia due to Pseudomonas: Secondary | ICD-10-CM | POA: Diagnosis not present

## 2020-10-22 DIAGNOSIS — U071 COVID-19: Secondary | ICD-10-CM | POA: Diagnosis not present

## 2020-10-23 DIAGNOSIS — J151 Pneumonia due to Pseudomonas: Secondary | ICD-10-CM | POA: Diagnosis not present

## 2020-10-23 DIAGNOSIS — U071 COVID-19: Secondary | ICD-10-CM | POA: Diagnosis not present

## 2020-10-23 DIAGNOSIS — N184 Chronic kidney disease, stage 4 (severe): Secondary | ICD-10-CM | POA: Diagnosis not present

## 2020-10-23 DIAGNOSIS — I129 Hypertensive chronic kidney disease with stage 1 through stage 4 chronic kidney disease, or unspecified chronic kidney disease: Secondary | ICD-10-CM | POA: Diagnosis not present

## 2020-10-23 DIAGNOSIS — R5381 Other malaise: Secondary | ICD-10-CM | POA: Diagnosis not present

## 2020-10-23 DIAGNOSIS — N179 Acute kidney failure, unspecified: Secondary | ICD-10-CM | POA: Diagnosis not present

## 2020-10-24 DIAGNOSIS — R531 Weakness: Secondary | ICD-10-CM | POA: Diagnosis not present

## 2020-10-24 DIAGNOSIS — I10 Essential (primary) hypertension: Secondary | ICD-10-CM | POA: Diagnosis not present

## 2020-10-24 DIAGNOSIS — R5381 Other malaise: Secondary | ICD-10-CM | POA: Diagnosis not present

## 2020-10-24 DIAGNOSIS — N179 Acute kidney failure, unspecified: Secondary | ICD-10-CM | POA: Diagnosis not present

## 2020-10-24 DIAGNOSIS — N184 Chronic kidney disease, stage 4 (severe): Secondary | ICD-10-CM | POA: Diagnosis not present

## 2020-10-24 DIAGNOSIS — J151 Pneumonia due to Pseudomonas: Secondary | ICD-10-CM | POA: Diagnosis not present

## 2020-10-24 DIAGNOSIS — D631 Anemia in chronic kidney disease: Secondary | ICD-10-CM | POA: Diagnosis not present

## 2020-10-25 DIAGNOSIS — R5381 Other malaise: Secondary | ICD-10-CM | POA: Diagnosis not present

## 2020-10-25 DIAGNOSIS — E440 Moderate protein-calorie malnutrition: Secondary | ICD-10-CM | POA: Diagnosis not present

## 2020-10-25 DIAGNOSIS — I129 Hypertensive chronic kidney disease with stage 1 through stage 4 chronic kidney disease, or unspecified chronic kidney disease: Secondary | ICD-10-CM | POA: Diagnosis not present

## 2020-10-25 DIAGNOSIS — N179 Acute kidney failure, unspecified: Secondary | ICD-10-CM | POA: Diagnosis not present

## 2020-10-25 DIAGNOSIS — J151 Pneumonia due to Pseudomonas: Secondary | ICD-10-CM | POA: Diagnosis not present

## 2020-10-25 DIAGNOSIS — N184 Chronic kidney disease, stage 4 (severe): Secondary | ICD-10-CM | POA: Diagnosis not present

## 2020-10-25 DIAGNOSIS — R531 Weakness: Secondary | ICD-10-CM | POA: Diagnosis not present

## 2020-10-26 DIAGNOSIS — N184 Chronic kidney disease, stage 4 (severe): Secondary | ICD-10-CM | POA: Diagnosis not present

## 2020-10-26 DIAGNOSIS — N179 Acute kidney failure, unspecified: Secondary | ICD-10-CM | POA: Diagnosis not present

## 2020-10-26 DIAGNOSIS — E440 Moderate protein-calorie malnutrition: Secondary | ICD-10-CM | POA: Diagnosis not present

## 2020-10-26 DIAGNOSIS — I129 Hypertensive chronic kidney disease with stage 1 through stage 4 chronic kidney disease, or unspecified chronic kidney disease: Secondary | ICD-10-CM | POA: Diagnosis not present

## 2020-10-26 DIAGNOSIS — J151 Pneumonia due to Pseudomonas: Secondary | ICD-10-CM | POA: Diagnosis not present

## 2020-10-26 DIAGNOSIS — R5381 Other malaise: Secondary | ICD-10-CM | POA: Diagnosis not present

## 2020-10-26 DIAGNOSIS — R531 Weakness: Secondary | ICD-10-CM | POA: Diagnosis not present

## 2020-10-27 DIAGNOSIS — Z7401 Bed confinement status: Secondary | ICD-10-CM | POA: Diagnosis not present

## 2020-10-27 DIAGNOSIS — U071 COVID-19: Secondary | ICD-10-CM | POA: Diagnosis not present

## 2020-10-27 DIAGNOSIS — N179 Acute kidney failure, unspecified: Secondary | ICD-10-CM | POA: Diagnosis not present

## 2020-10-27 DIAGNOSIS — N184 Chronic kidney disease, stage 4 (severe): Secondary | ICD-10-CM | POA: Diagnosis not present

## 2020-10-27 DIAGNOSIS — I129 Hypertensive chronic kidney disease with stage 1 through stage 4 chronic kidney disease, or unspecified chronic kidney disease: Secondary | ICD-10-CM | POA: Diagnosis not present

## 2020-10-27 DIAGNOSIS — R404 Transient alteration of awareness: Secondary | ICD-10-CM | POA: Diagnosis not present

## 2020-10-27 DIAGNOSIS — R279 Unspecified lack of coordination: Secondary | ICD-10-CM | POA: Diagnosis not present

## 2020-10-27 DIAGNOSIS — E440 Moderate protein-calorie malnutrition: Secondary | ICD-10-CM | POA: Diagnosis not present

## 2020-10-27 DIAGNOSIS — J151 Pneumonia due to Pseudomonas: Secondary | ICD-10-CM | POA: Diagnosis not present

## 2020-10-27 DIAGNOSIS — J449 Chronic obstructive pulmonary disease, unspecified: Secondary | ICD-10-CM | POA: Diagnosis not present

## 2020-10-27 DIAGNOSIS — R5381 Other malaise: Secondary | ICD-10-CM | POA: Diagnosis not present

## 2020-10-28 DIAGNOSIS — E7800 Pure hypercholesterolemia, unspecified: Secondary | ICD-10-CM | POA: Diagnosis not present

## 2020-10-28 DIAGNOSIS — I82452 Acute embolism and thrombosis of left peroneal vein: Secondary | ICD-10-CM | POA: Diagnosis not present

## 2020-10-28 DIAGNOSIS — I739 Peripheral vascular disease, unspecified: Secondary | ICD-10-CM | POA: Diagnosis not present

## 2020-10-28 DIAGNOSIS — H9193 Unspecified hearing loss, bilateral: Secondary | ICD-10-CM | POA: Diagnosis not present

## 2020-10-28 DIAGNOSIS — N184 Chronic kidney disease, stage 4 (severe): Secondary | ICD-10-CM | POA: Diagnosis not present

## 2020-10-28 DIAGNOSIS — K219 Gastro-esophageal reflux disease without esophagitis: Secondary | ICD-10-CM | POA: Diagnosis not present

## 2020-10-28 DIAGNOSIS — D631 Anemia in chronic kidney disease: Secondary | ICD-10-CM | POA: Diagnosis not present

## 2020-10-28 DIAGNOSIS — I129 Hypertensive chronic kidney disease with stage 1 through stage 4 chronic kidney disease, or unspecified chronic kidney disease: Secondary | ICD-10-CM | POA: Diagnosis not present

## 2020-10-28 DIAGNOSIS — E440 Moderate protein-calorie malnutrition: Secondary | ICD-10-CM | POA: Diagnosis not present

## 2020-10-28 DIAGNOSIS — I82412 Acute embolism and thrombosis of left femoral vein: Secondary | ICD-10-CM | POA: Diagnosis not present

## 2020-10-28 DIAGNOSIS — I252 Old myocardial infarction: Secondary | ICD-10-CM | POA: Diagnosis not present

## 2020-10-28 DIAGNOSIS — R339 Retention of urine, unspecified: Secondary | ICD-10-CM | POA: Diagnosis not present

## 2020-10-28 DIAGNOSIS — L89152 Pressure ulcer of sacral region, stage 2: Secondary | ICD-10-CM | POA: Diagnosis not present

## 2020-10-28 DIAGNOSIS — M549 Dorsalgia, unspecified: Secondary | ICD-10-CM | POA: Diagnosis not present

## 2020-10-28 DIAGNOSIS — Z466 Encounter for fitting and adjustment of urinary device: Secondary | ICD-10-CM | POA: Diagnosis not present

## 2020-10-28 DIAGNOSIS — I82811 Embolism and thrombosis of superficial veins of right lower extremities: Secondary | ICD-10-CM | POA: Diagnosis not present

## 2020-10-30 DIAGNOSIS — N184 Chronic kidney disease, stage 4 (severe): Secondary | ICD-10-CM | POA: Diagnosis not present

## 2020-10-30 DIAGNOSIS — D638 Anemia in other chronic diseases classified elsewhere: Secondary | ICD-10-CM | POA: Diagnosis not present

## 2020-10-30 DIAGNOSIS — E7849 Other hyperlipidemia: Secondary | ICD-10-CM | POA: Diagnosis not present

## 2020-10-30 DIAGNOSIS — I129 Hypertensive chronic kidney disease with stage 1 through stage 4 chronic kidney disease, or unspecified chronic kidney disease: Secondary | ICD-10-CM | POA: Diagnosis not present

## 2020-11-01 DIAGNOSIS — E440 Moderate protein-calorie malnutrition: Secondary | ICD-10-CM | POA: Diagnosis not present

## 2020-11-01 DIAGNOSIS — R5381 Other malaise: Secondary | ICD-10-CM | POA: Diagnosis not present

## 2020-11-01 DIAGNOSIS — Z978 Presence of other specified devices: Secondary | ICD-10-CM | POA: Diagnosis not present

## 2020-11-01 DIAGNOSIS — Z8616 Personal history of COVID-19: Secondary | ICD-10-CM | POA: Diagnosis not present

## 2020-11-14 DIAGNOSIS — R338 Other retention of urine: Secondary | ICD-10-CM | POA: Diagnosis not present

## 2020-11-14 DIAGNOSIS — J151 Pneumonia due to Pseudomonas: Secondary | ICD-10-CM | POA: Diagnosis not present

## 2020-11-14 DIAGNOSIS — N401 Enlarged prostate with lower urinary tract symptoms: Secondary | ICD-10-CM | POA: Diagnosis not present

## 2020-11-14 DIAGNOSIS — Z515 Encounter for palliative care: Secondary | ICD-10-CM | POA: Diagnosis not present

## 2020-11-14 DIAGNOSIS — J189 Pneumonia, unspecified organism: Secondary | ICD-10-CM | POA: Diagnosis not present

## 2020-11-14 DIAGNOSIS — N476 Balanoposthitis: Secondary | ICD-10-CM | POA: Diagnosis not present

## 2020-11-14 DIAGNOSIS — N390 Urinary tract infection, site not specified: Secondary | ICD-10-CM | POA: Diagnosis not present

## 2020-11-14 DIAGNOSIS — N184 Chronic kidney disease, stage 4 (severe): Secondary | ICD-10-CM | POA: Diagnosis not present

## 2020-11-14 DIAGNOSIS — J439 Emphysema, unspecified: Secondary | ICD-10-CM | POA: Diagnosis not present

## 2020-11-18 DIAGNOSIS — J449 Chronic obstructive pulmonary disease, unspecified: Secondary | ICD-10-CM | POA: Diagnosis not present

## 2020-11-27 DIAGNOSIS — K219 Gastro-esophageal reflux disease without esophagitis: Secondary | ICD-10-CM | POA: Diagnosis not present

## 2020-11-27 DIAGNOSIS — J449 Chronic obstructive pulmonary disease, unspecified: Secondary | ICD-10-CM | POA: Diagnosis not present

## 2020-11-27 DIAGNOSIS — L89152 Pressure ulcer of sacral region, stage 2: Secondary | ICD-10-CM | POA: Diagnosis not present

## 2020-11-27 DIAGNOSIS — M549 Dorsalgia, unspecified: Secondary | ICD-10-CM | POA: Diagnosis not present

## 2020-11-27 DIAGNOSIS — D631 Anemia in chronic kidney disease: Secondary | ICD-10-CM | POA: Diagnosis not present

## 2020-11-27 DIAGNOSIS — I82412 Acute embolism and thrombosis of left femoral vein: Secondary | ICD-10-CM | POA: Diagnosis not present

## 2020-11-27 DIAGNOSIS — I129 Hypertensive chronic kidney disease with stage 1 through stage 4 chronic kidney disease, or unspecified chronic kidney disease: Secondary | ICD-10-CM | POA: Diagnosis not present

## 2020-11-27 DIAGNOSIS — I252 Old myocardial infarction: Secondary | ICD-10-CM | POA: Diagnosis not present

## 2020-11-27 DIAGNOSIS — Z466 Encounter for fitting and adjustment of urinary device: Secondary | ICD-10-CM | POA: Diagnosis not present

## 2020-11-27 DIAGNOSIS — H9193 Unspecified hearing loss, bilateral: Secondary | ICD-10-CM | POA: Diagnosis not present

## 2020-11-27 DIAGNOSIS — U071 COVID-19: Secondary | ICD-10-CM | POA: Diagnosis not present

## 2020-11-27 DIAGNOSIS — I82452 Acute embolism and thrombosis of left peroneal vein: Secondary | ICD-10-CM | POA: Diagnosis not present

## 2020-11-27 DIAGNOSIS — E440 Moderate protein-calorie malnutrition: Secondary | ICD-10-CM | POA: Diagnosis not present

## 2020-11-27 DIAGNOSIS — E7800 Pure hypercholesterolemia, unspecified: Secondary | ICD-10-CM | POA: Diagnosis not present

## 2020-11-27 DIAGNOSIS — I82811 Embolism and thrombosis of superficial veins of right lower extremities: Secondary | ICD-10-CM | POA: Diagnosis not present

## 2020-11-27 DIAGNOSIS — I739 Peripheral vascular disease, unspecified: Secondary | ICD-10-CM | POA: Diagnosis not present

## 2020-11-27 DIAGNOSIS — N184 Chronic kidney disease, stage 4 (severe): Secondary | ICD-10-CM | POA: Diagnosis not present

## 2020-11-27 DIAGNOSIS — R339 Retention of urine, unspecified: Secondary | ICD-10-CM | POA: Diagnosis not present

## 2020-11-29 ENCOUNTER — Ambulatory Visit: Payer: Medicare Other | Admitting: Urology

## 2020-12-06 DIAGNOSIS — R778 Other specified abnormalities of plasma proteins: Secondary | ICD-10-CM | POA: Diagnosis not present

## 2020-12-06 DIAGNOSIS — A419 Sepsis, unspecified organism: Secondary | ICD-10-CM | POA: Diagnosis not present

## 2020-12-06 DIAGNOSIS — R079 Chest pain, unspecified: Secondary | ICD-10-CM | POA: Diagnosis not present

## 2020-12-06 DIAGNOSIS — I129 Hypertensive chronic kidney disease with stage 1 through stage 4 chronic kidney disease, or unspecified chronic kidney disease: Secondary | ICD-10-CM | POA: Diagnosis not present

## 2020-12-06 DIAGNOSIS — Z87891 Personal history of nicotine dependence: Secondary | ICD-10-CM | POA: Diagnosis not present

## 2020-12-06 DIAGNOSIS — M549 Dorsalgia, unspecified: Secondary | ICD-10-CM | POA: Diagnosis not present

## 2020-12-06 DIAGNOSIS — N323 Diverticulum of bladder: Secondary | ICD-10-CM | POA: Diagnosis not present

## 2020-12-06 DIAGNOSIS — Z20822 Contact with and (suspected) exposure to covid-19: Secondary | ICD-10-CM | POA: Diagnosis not present

## 2020-12-06 DIAGNOSIS — I700 Atherosclerosis of aorta: Secondary | ICD-10-CM | POA: Diagnosis not present

## 2020-12-06 DIAGNOSIS — R059 Cough, unspecified: Secondary | ICD-10-CM | POA: Diagnosis not present

## 2020-12-06 DIAGNOSIS — I739 Peripheral vascular disease, unspecified: Secondary | ICD-10-CM | POA: Diagnosis not present

## 2020-12-06 DIAGNOSIS — Z66 Do not resuscitate: Secondary | ICD-10-CM | POA: Diagnosis not present

## 2020-12-06 DIAGNOSIS — M4317 Spondylolisthesis, lumbosacral region: Secondary | ICD-10-CM | POA: Diagnosis not present

## 2020-12-06 DIAGNOSIS — J439 Emphysema, unspecified: Secondary | ICD-10-CM | POA: Diagnosis not present

## 2020-12-06 DIAGNOSIS — H9193 Unspecified hearing loss, bilateral: Secondary | ICD-10-CM | POA: Diagnosis not present

## 2020-12-06 DIAGNOSIS — N3289 Other specified disorders of bladder: Secondary | ICD-10-CM | POA: Diagnosis not present

## 2020-12-06 DIAGNOSIS — K219 Gastro-esophageal reflux disease without esophagitis: Secondary | ICD-10-CM | POA: Diagnosis not present

## 2020-12-06 DIAGNOSIS — R0602 Shortness of breath: Secondary | ICD-10-CM | POA: Diagnosis not present

## 2020-12-06 DIAGNOSIS — J189 Pneumonia, unspecified organism: Secondary | ICD-10-CM | POA: Diagnosis not present

## 2020-12-06 DIAGNOSIS — E7800 Pure hypercholesterolemia, unspecified: Secondary | ICD-10-CM | POA: Diagnosis not present

## 2020-12-06 DIAGNOSIS — N179 Acute kidney failure, unspecified: Secondary | ICD-10-CM | POA: Diagnosis not present

## 2020-12-06 DIAGNOSIS — R6520 Severe sepsis without septic shock: Secondary | ICD-10-CM | POA: Diagnosis not present

## 2020-12-06 DIAGNOSIS — I252 Old myocardial infarction: Secondary | ICD-10-CM | POA: Diagnosis not present

## 2020-12-06 DIAGNOSIS — E860 Dehydration: Secondary | ICD-10-CM | POA: Diagnosis not present

## 2020-12-06 DIAGNOSIS — N184 Chronic kidney disease, stage 4 (severe): Secondary | ICD-10-CM | POA: Diagnosis not present

## 2020-12-06 DIAGNOSIS — K7689 Other specified diseases of liver: Secondary | ICD-10-CM | POA: Diagnosis not present

## 2020-12-06 DIAGNOSIS — R110 Nausea: Secondary | ICD-10-CM | POA: Diagnosis not present

## 2020-12-12 DIAGNOSIS — N184 Chronic kidney disease, stage 4 (severe): Secondary | ICD-10-CM | POA: Diagnosis not present

## 2020-12-12 DIAGNOSIS — Z515 Encounter for palliative care: Secondary | ICD-10-CM | POA: Diagnosis not present

## 2020-12-14 DIAGNOSIS — N179 Acute kidney failure, unspecified: Secondary | ICD-10-CM | POA: Diagnosis not present

## 2020-12-14 DIAGNOSIS — J189 Pneumonia, unspecified organism: Secondary | ICD-10-CM | POA: Diagnosis not present

## 2020-12-14 DIAGNOSIS — R778 Other specified abnormalities of plasma proteins: Secondary | ICD-10-CM | POA: Diagnosis not present

## 2020-12-14 DIAGNOSIS — A419 Sepsis, unspecified organism: Secondary | ICD-10-CM | POA: Diagnosis not present

## 2020-12-27 DIAGNOSIS — I700 Atherosclerosis of aorta: Secondary | ICD-10-CM | POA: Diagnosis not present

## 2020-12-27 DIAGNOSIS — I2510 Atherosclerotic heart disease of native coronary artery without angina pectoris: Secondary | ICD-10-CM | POA: Diagnosis not present

## 2020-12-27 DIAGNOSIS — M19012 Primary osteoarthritis, left shoulder: Secondary | ICD-10-CM | POA: Diagnosis not present

## 2020-12-27 DIAGNOSIS — I82452 Acute embolism and thrombosis of left peroneal vein: Secondary | ICD-10-CM | POA: Diagnosis not present

## 2020-12-27 DIAGNOSIS — E440 Moderate protein-calorie malnutrition: Secondary | ICD-10-CM | POA: Diagnosis not present

## 2020-12-27 DIAGNOSIS — I82412 Acute embolism and thrombosis of left femoral vein: Secondary | ICD-10-CM | POA: Diagnosis not present

## 2020-12-27 DIAGNOSIS — I739 Peripheral vascular disease, unspecified: Secondary | ICD-10-CM | POA: Diagnosis not present

## 2020-12-27 DIAGNOSIS — M47816 Spondylosis without myelopathy or radiculopathy, lumbar region: Secondary | ICD-10-CM | POA: Diagnosis not present

## 2020-12-27 DIAGNOSIS — I252 Old myocardial infarction: Secondary | ICD-10-CM | POA: Diagnosis not present

## 2020-12-27 DIAGNOSIS — M5136 Other intervertebral disc degeneration, lumbar region: Secondary | ICD-10-CM | POA: Diagnosis not present

## 2020-12-27 DIAGNOSIS — D631 Anemia in chronic kidney disease: Secondary | ICD-10-CM | POA: Diagnosis not present

## 2020-12-27 DIAGNOSIS — M19011 Primary osteoarthritis, right shoulder: Secondary | ICD-10-CM | POA: Diagnosis not present

## 2020-12-27 DIAGNOSIS — I82811 Embolism and thrombosis of superficial veins of right lower extremities: Secondary | ICD-10-CM | POA: Diagnosis not present

## 2020-12-27 DIAGNOSIS — J439 Emphysema, unspecified: Secondary | ICD-10-CM | POA: Diagnosis not present

## 2020-12-27 DIAGNOSIS — N184 Chronic kidney disease, stage 4 (severe): Secondary | ICD-10-CM | POA: Diagnosis not present

## 2020-12-27 DIAGNOSIS — I129 Hypertensive chronic kidney disease with stage 1 through stage 4 chronic kidney disease, or unspecified chronic kidney disease: Secondary | ICD-10-CM | POA: Diagnosis not present

## 2020-12-28 DIAGNOSIS — J449 Chronic obstructive pulmonary disease, unspecified: Secondary | ICD-10-CM | POA: Diagnosis not present

## 2020-12-28 DIAGNOSIS — U071 COVID-19: Secondary | ICD-10-CM | POA: Diagnosis not present

## 2021-01-12 IMAGING — CR RIGHT HAND - COMPLETE 3+ VIEW
4 series · 8 of 8 positions shown · non-contrast
Comparison: None.

CLINICAL DATA: Fall 2 days ago with right hand pain, initial
encounter

EXAM:
RIGHT HAND - COMPLETE 3+ VIEW

[Series 1: pa · 0.17mm/px · 2 of 2 slices shown]
[im 1/2]
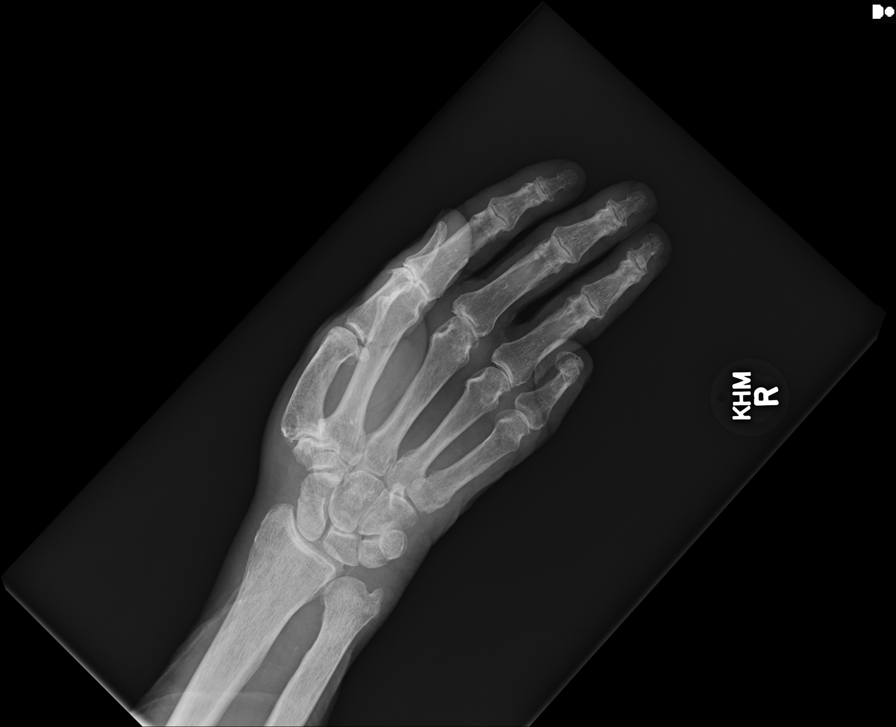
[im 2/2]
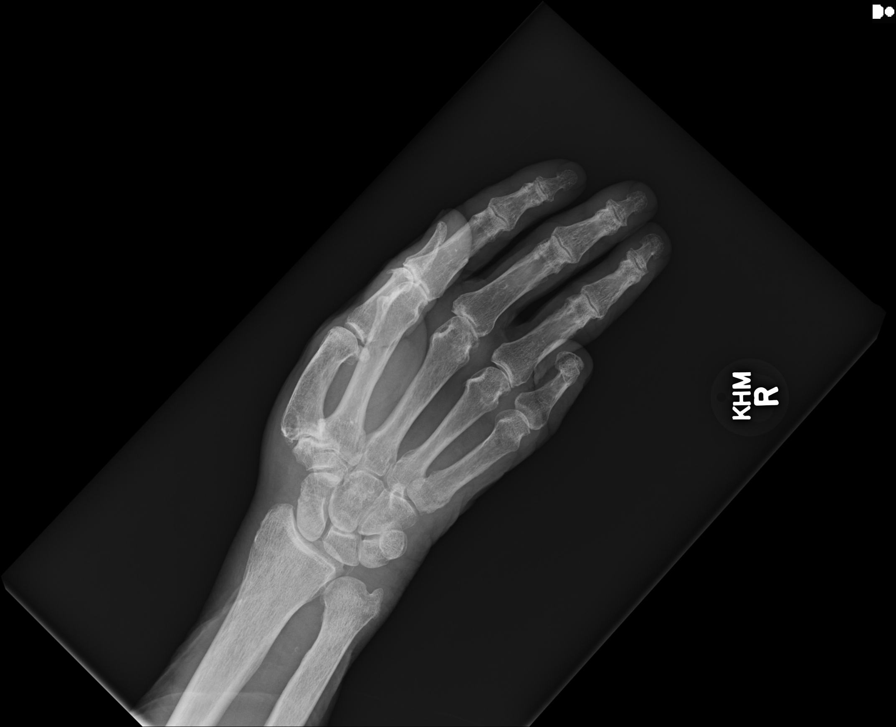

[Series 2: oblique · 0.17mm/px · 2 of 2 slices shown]
[im 1/2]
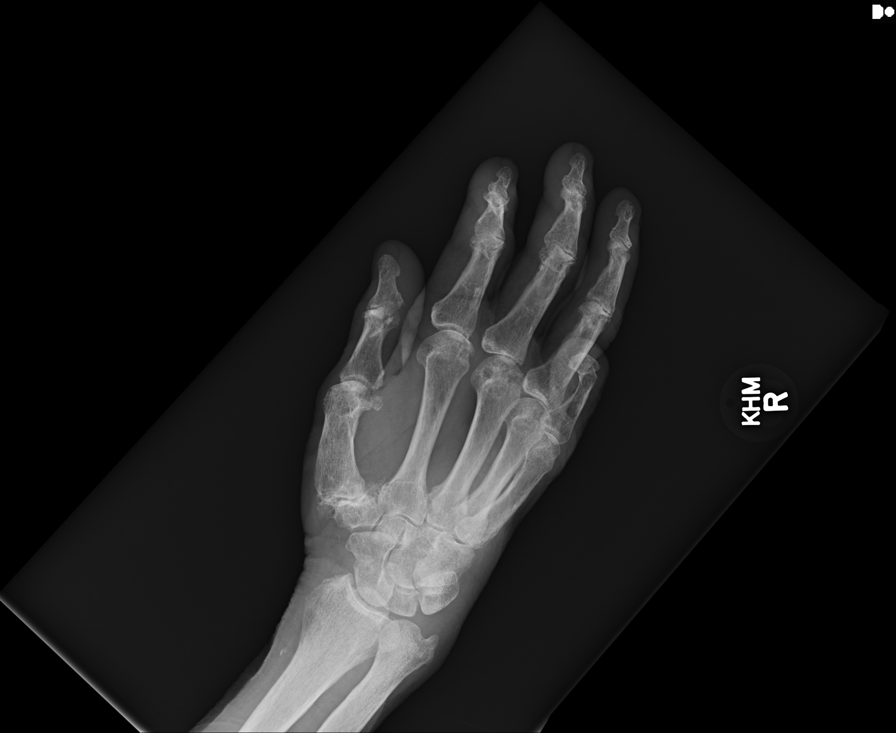
[im 2/2]
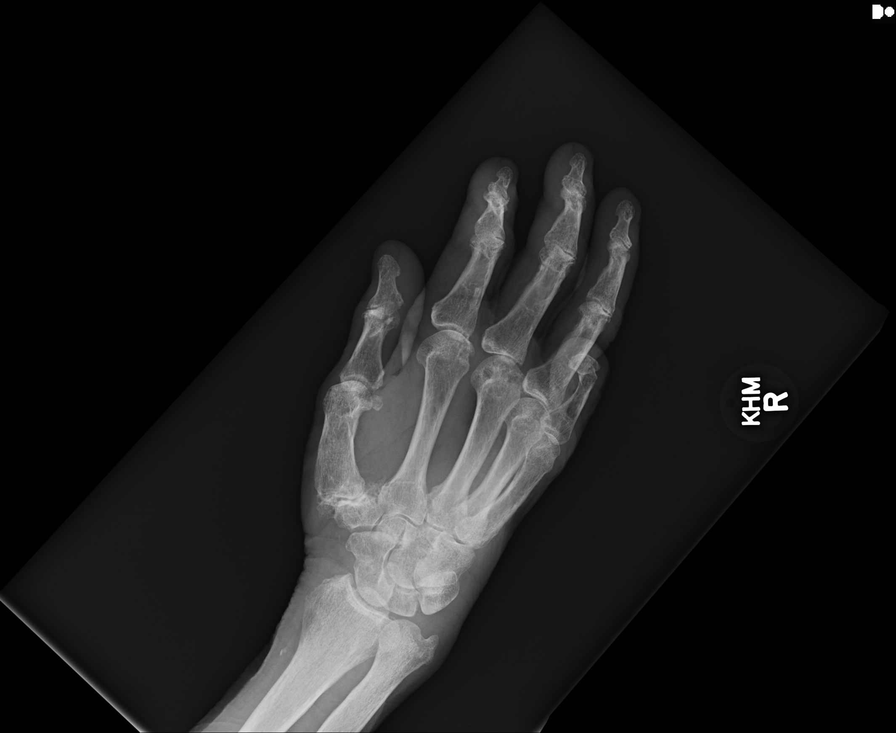

[Series 3: lat · 0.17mm/px · 2 of 2 slices shown (1 of 2)]
[im 1/2]
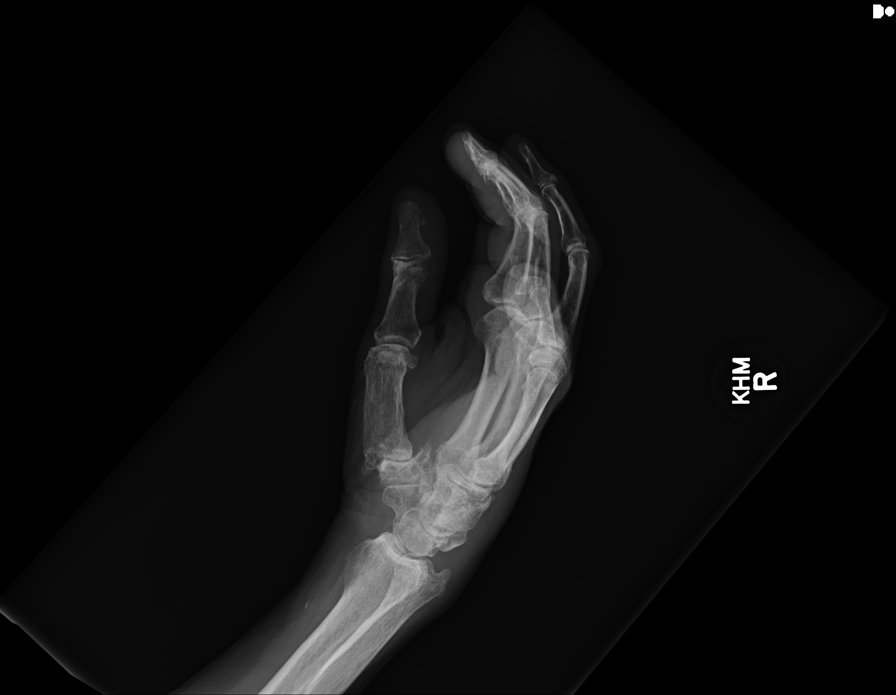
[im 2/2]
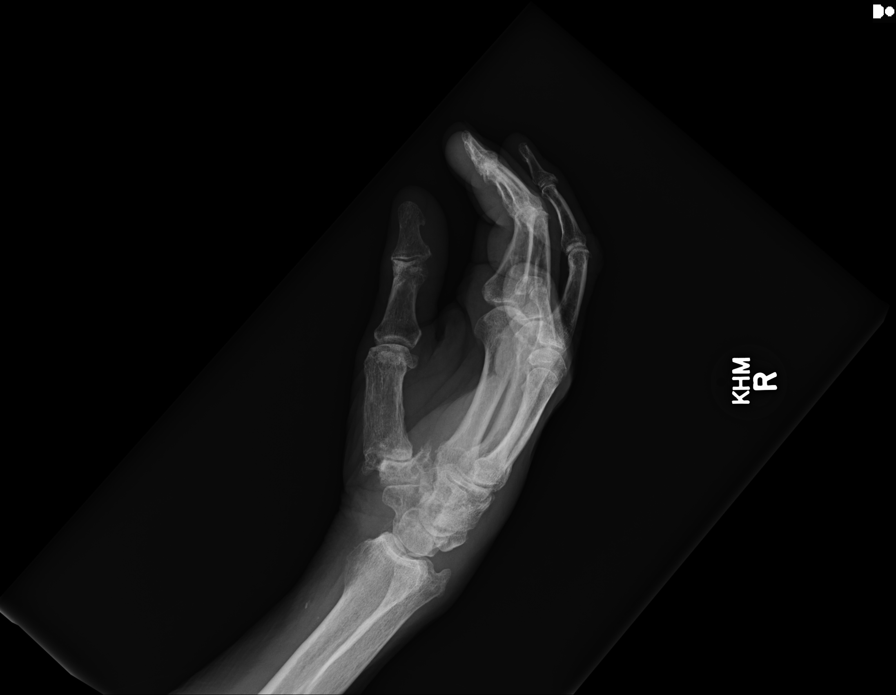

[Series 4: lat · 0.17mm/px · 2 of 2 slices shown (2 of 2)]
[im 1/2]
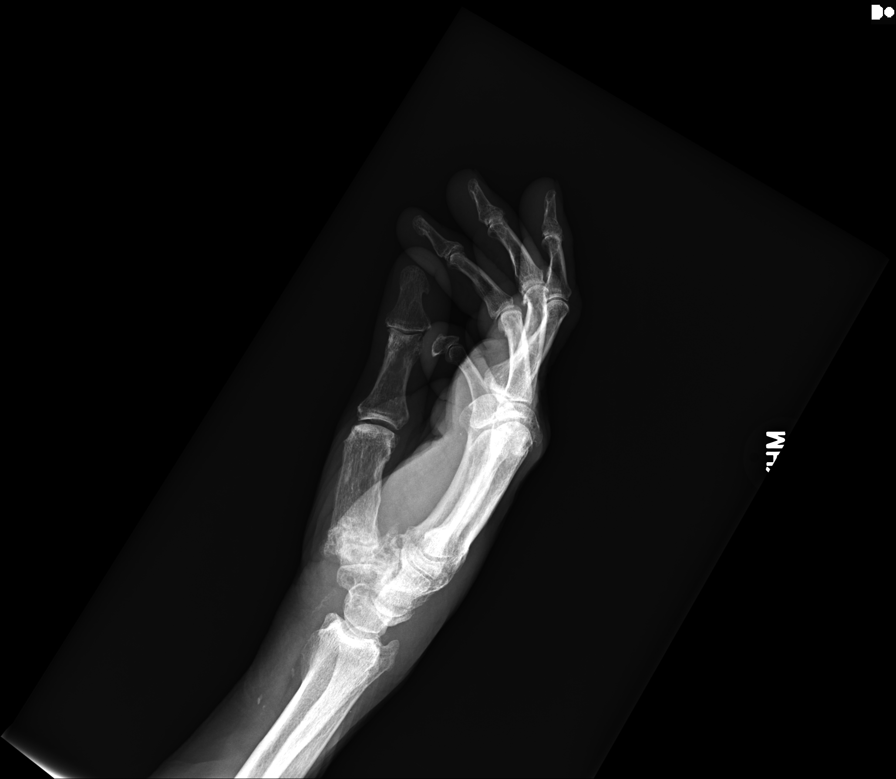
[im 2/2]
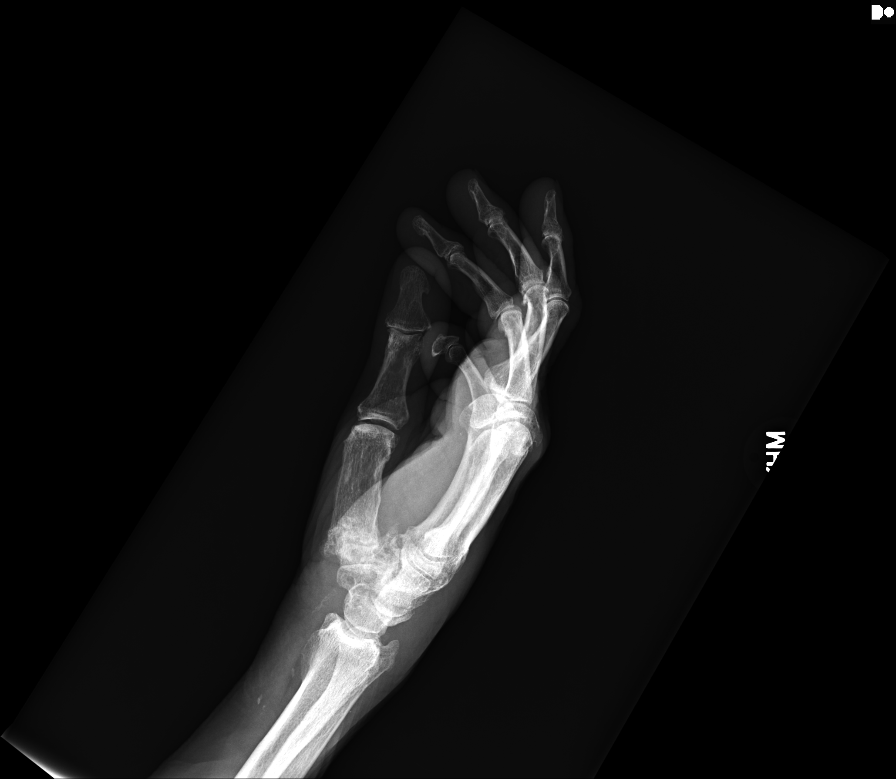

[8 of 8 positions shown; findings below may reference images not displayed]

FINDINGS: Degenerative changes of the first [facility] joint are noted. Prior
amputation of fifth digit is seen in the middle phalanx.
Degenerative interphalangeal joint changes are seen. No acute
fracture or dislocation is noted.
IMPRESSION: Degenerative change without acute abnormality.

## 2021-01-26 DIAGNOSIS — M47816 Spondylosis without myelopathy or radiculopathy, lumbar region: Secondary | ICD-10-CM | POA: Diagnosis not present

## 2021-01-26 DIAGNOSIS — I739 Peripheral vascular disease, unspecified: Secondary | ICD-10-CM | POA: Diagnosis not present

## 2021-01-26 DIAGNOSIS — M19011 Primary osteoarthritis, right shoulder: Secondary | ICD-10-CM | POA: Diagnosis not present

## 2021-01-26 DIAGNOSIS — D631 Anemia in chronic kidney disease: Secondary | ICD-10-CM | POA: Diagnosis not present

## 2021-01-26 DIAGNOSIS — I82811 Embolism and thrombosis of superficial veins of right lower extremities: Secondary | ICD-10-CM | POA: Diagnosis not present

## 2021-01-26 DIAGNOSIS — I129 Hypertensive chronic kidney disease with stage 1 through stage 4 chronic kidney disease, or unspecified chronic kidney disease: Secondary | ICD-10-CM | POA: Diagnosis not present

## 2021-01-26 DIAGNOSIS — J439 Emphysema, unspecified: Secondary | ICD-10-CM | POA: Diagnosis not present

## 2021-01-26 DIAGNOSIS — E440 Moderate protein-calorie malnutrition: Secondary | ICD-10-CM | POA: Diagnosis not present

## 2021-01-26 DIAGNOSIS — M19012 Primary osteoarthritis, left shoulder: Secondary | ICD-10-CM | POA: Diagnosis not present

## 2021-01-26 DIAGNOSIS — M5136 Other intervertebral disc degeneration, lumbar region: Secondary | ICD-10-CM | POA: Diagnosis not present

## 2021-01-26 DIAGNOSIS — I252 Old myocardial infarction: Secondary | ICD-10-CM | POA: Diagnosis not present

## 2021-01-26 DIAGNOSIS — I700 Atherosclerosis of aorta: Secondary | ICD-10-CM | POA: Diagnosis not present

## 2021-01-26 DIAGNOSIS — I82412 Acute embolism and thrombosis of left femoral vein: Secondary | ICD-10-CM | POA: Diagnosis not present

## 2021-01-26 DIAGNOSIS — I82452 Acute embolism and thrombosis of left peroneal vein: Secondary | ICD-10-CM | POA: Diagnosis not present

## 2021-01-26 DIAGNOSIS — N184 Chronic kidney disease, stage 4 (severe): Secondary | ICD-10-CM | POA: Diagnosis not present

## 2021-01-26 DIAGNOSIS — I2510 Atherosclerotic heart disease of native coronary artery without angina pectoris: Secondary | ICD-10-CM | POA: Diagnosis not present

## 2021-01-27 DIAGNOSIS — J449 Chronic obstructive pulmonary disease, unspecified: Secondary | ICD-10-CM | POA: Diagnosis not present

## 2021-01-27 DIAGNOSIS — U071 COVID-19: Secondary | ICD-10-CM | POA: Diagnosis not present

## 2021-02-27 DIAGNOSIS — R0989 Other specified symptoms and signs involving the circulatory and respiratory systems: Secondary | ICD-10-CM | POA: Diagnosis not present

## 2021-02-27 DIAGNOSIS — J449 Chronic obstructive pulmonary disease, unspecified: Secondary | ICD-10-CM | POA: Diagnosis not present

## 2021-02-27 DIAGNOSIS — R059 Cough, unspecified: Secondary | ICD-10-CM | POA: Diagnosis not present

## 2021-02-27 DIAGNOSIS — U071 COVID-19: Secondary | ICD-10-CM | POA: Diagnosis not present

## 2021-02-27 DIAGNOSIS — F17210 Nicotine dependence, cigarettes, uncomplicated: Secondary | ICD-10-CM | POA: Diagnosis not present

## 2021-03-29 DIAGNOSIS — J449 Chronic obstructive pulmonary disease, unspecified: Secondary | ICD-10-CM | POA: Diagnosis not present

## 2021-03-29 DIAGNOSIS — U071 COVID-19: Secondary | ICD-10-CM | POA: Diagnosis not present

## 2021-04-19 DIAGNOSIS — R300 Dysuria: Secondary | ICD-10-CM | POA: Diagnosis not present

## 2021-04-19 DIAGNOSIS — F17210 Nicotine dependence, cigarettes, uncomplicated: Secondary | ICD-10-CM | POA: Diagnosis not present

## 2021-04-29 DIAGNOSIS — J449 Chronic obstructive pulmonary disease, unspecified: Secondary | ICD-10-CM | POA: Diagnosis not present

## 2021-04-29 DIAGNOSIS — U071 COVID-19: Secondary | ICD-10-CM | POA: Diagnosis not present

## 2021-05-09 DIAGNOSIS — E782 Mixed hyperlipidemia: Secondary | ICD-10-CM | POA: Diagnosis not present

## 2021-05-09 DIAGNOSIS — R5383 Other fatigue: Secondary | ICD-10-CM | POA: Diagnosis not present

## 2021-05-09 DIAGNOSIS — I10 Essential (primary) hypertension: Secondary | ICD-10-CM | POA: Diagnosis not present

## 2021-05-09 DIAGNOSIS — Z515 Encounter for palliative care: Secondary | ICD-10-CM | POA: Diagnosis not present

## 2021-05-09 DIAGNOSIS — N184 Chronic kidney disease, stage 4 (severe): Secondary | ICD-10-CM | POA: Diagnosis not present

## 2021-05-09 DIAGNOSIS — K2100 Gastro-esophageal reflux disease with esophagitis, without bleeding: Secondary | ICD-10-CM | POA: Diagnosis not present

## 2021-05-30 DIAGNOSIS — J449 Chronic obstructive pulmonary disease, unspecified: Secondary | ICD-10-CM | POA: Diagnosis not present

## 2021-05-30 DIAGNOSIS — U071 COVID-19: Secondary | ICD-10-CM | POA: Diagnosis not present

## 2021-06-20 DIAGNOSIS — Z515 Encounter for palliative care: Secondary | ICD-10-CM | POA: Diagnosis not present

## 2021-06-20 DIAGNOSIS — N184 Chronic kidney disease, stage 4 (severe): Secondary | ICD-10-CM | POA: Diagnosis not present

## 2021-06-27 DIAGNOSIS — U071 COVID-19: Secondary | ICD-10-CM | POA: Diagnosis not present

## 2021-06-27 DIAGNOSIS — J449 Chronic obstructive pulmonary disease, unspecified: Secondary | ICD-10-CM | POA: Diagnosis not present

## 2021-07-14 DIAGNOSIS — R300 Dysuria: Secondary | ICD-10-CM | POA: Diagnosis not present

## 2021-07-28 DIAGNOSIS — U071 COVID-19: Secondary | ICD-10-CM | POA: Diagnosis not present

## 2021-07-28 DIAGNOSIS — J449 Chronic obstructive pulmonary disease, unspecified: Secondary | ICD-10-CM | POA: Diagnosis not present

## 2021-08-01 DIAGNOSIS — N184 Chronic kidney disease, stage 4 (severe): Secondary | ICD-10-CM | POA: Diagnosis not present

## 2021-08-01 DIAGNOSIS — Z515 Encounter for palliative care: Secondary | ICD-10-CM | POA: Diagnosis not present

## 2021-08-07 DIAGNOSIS — N189 Chronic kidney disease, unspecified: Secondary | ICD-10-CM | POA: Diagnosis not present

## 2021-08-07 DIAGNOSIS — U071 COVID-19: Secondary | ICD-10-CM | POA: Diagnosis not present

## 2021-08-07 DIAGNOSIS — R410 Disorientation, unspecified: Secondary | ICD-10-CM | POA: Diagnosis not present

## 2021-08-07 DIAGNOSIS — R531 Weakness: Secondary | ICD-10-CM | POA: Diagnosis not present

## 2021-08-07 DIAGNOSIS — E43 Unspecified severe protein-calorie malnutrition: Secondary | ICD-10-CM | POA: Diagnosis not present

## 2021-08-07 DIAGNOSIS — R4182 Altered mental status, unspecified: Secondary | ICD-10-CM | POA: Diagnosis not present

## 2021-08-07 DIAGNOSIS — I739 Peripheral vascular disease, unspecified: Secondary | ICD-10-CM | POA: Diagnosis not present

## 2021-08-07 DIAGNOSIS — Z9181 History of falling: Secondary | ICD-10-CM | POA: Diagnosis not present

## 2021-08-07 DIAGNOSIS — N184 Chronic kidney disease, stage 4 (severe): Secondary | ICD-10-CM | POA: Diagnosis not present

## 2021-08-07 DIAGNOSIS — I1310 Hypertensive heart and chronic kidney disease without heart failure, with stage 1 through stage 4 chronic kidney disease, or unspecified chronic kidney disease: Secondary | ICD-10-CM | POA: Diagnosis not present

## 2021-08-07 DIAGNOSIS — R239 Unspecified skin changes: Secondary | ICD-10-CM | POA: Diagnosis not present

## 2021-08-07 DIAGNOSIS — K219 Gastro-esophageal reflux disease without esophagitis: Secondary | ICD-10-CM | POA: Diagnosis not present

## 2021-08-07 DIAGNOSIS — G9341 Metabolic encephalopathy: Secondary | ICD-10-CM | POA: Diagnosis not present

## 2021-08-07 DIAGNOSIS — Z91199 Patient's noncompliance with other medical treatment and regimen due to unspecified reason: Secondary | ICD-10-CM | POA: Diagnosis not present

## 2021-08-07 DIAGNOSIS — I6381 Other cerebral infarction due to occlusion or stenosis of small artery: Secondary | ICD-10-CM | POA: Diagnosis not present

## 2021-08-07 DIAGNOSIS — E860 Dehydration: Secondary | ICD-10-CM | POA: Diagnosis not present

## 2021-08-07 DIAGNOSIS — Z20822 Contact with and (suspected) exposure to covid-19: Secondary | ICD-10-CM | POA: Diagnosis not present

## 2021-08-07 DIAGNOSIS — R2681 Unsteadiness on feet: Secondary | ICD-10-CM | POA: Diagnosis not present

## 2021-08-07 DIAGNOSIS — E46 Unspecified protein-calorie malnutrition: Secondary | ICD-10-CM | POA: Diagnosis not present

## 2021-08-07 DIAGNOSIS — I959 Hypotension, unspecified: Secondary | ICD-10-CM | POA: Diagnosis not present

## 2021-08-07 DIAGNOSIS — Z681 Body mass index (BMI) 19 or less, adult: Secondary | ICD-10-CM | POA: Diagnosis not present

## 2021-08-07 DIAGNOSIS — R278 Other lack of coordination: Secondary | ICD-10-CM | POA: Diagnosis not present

## 2021-08-07 DIAGNOSIS — N3001 Acute cystitis with hematuria: Secondary | ICD-10-CM | POA: Diagnosis not present

## 2021-08-07 DIAGNOSIS — Z7409 Other reduced mobility: Secondary | ICD-10-CM | POA: Diagnosis not present

## 2021-08-07 DIAGNOSIS — R6883 Chills (without fever): Secondary | ICD-10-CM | POA: Diagnosis not present

## 2021-08-07 DIAGNOSIS — R442 Other hallucinations: Secondary | ICD-10-CM | POA: Diagnosis not present

## 2021-08-08 DIAGNOSIS — N184 Chronic kidney disease, stage 4 (severe): Secondary | ICD-10-CM | POA: Diagnosis not present

## 2021-08-08 DIAGNOSIS — N3000 Acute cystitis without hematuria: Secondary | ICD-10-CM | POA: Diagnosis not present

## 2021-08-08 DIAGNOSIS — G9341 Metabolic encephalopathy: Secondary | ICD-10-CM | POA: Diagnosis not present

## 2021-08-08 DIAGNOSIS — D631 Anemia in chronic kidney disease: Secondary | ICD-10-CM | POA: Diagnosis not present

## 2021-08-09 DIAGNOSIS — N3001 Acute cystitis with hematuria: Secondary | ICD-10-CM | POA: Diagnosis not present

## 2021-08-09 DIAGNOSIS — N184 Chronic kidney disease, stage 4 (severe): Secondary | ICD-10-CM | POA: Diagnosis not present

## 2021-08-09 DIAGNOSIS — E43 Unspecified severe protein-calorie malnutrition: Secondary | ICD-10-CM | POA: Diagnosis not present

## 2021-08-09 DIAGNOSIS — R4182 Altered mental status, unspecified: Secondary | ICD-10-CM | POA: Diagnosis not present

## 2021-08-17 DIAGNOSIS — E43 Unspecified severe protein-calorie malnutrition: Secondary | ICD-10-CM | POA: Diagnosis not present

## 2021-08-17 DIAGNOSIS — R4182 Altered mental status, unspecified: Secondary | ICD-10-CM | POA: Diagnosis not present

## 2021-08-17 DIAGNOSIS — R443 Hallucinations, unspecified: Secondary | ICD-10-CM | POA: Diagnosis not present

## 2021-08-17 DIAGNOSIS — N3001 Acute cystitis with hematuria: Secondary | ICD-10-CM | POA: Diagnosis not present

## 2021-09-12 DIAGNOSIS — N184 Chronic kidney disease, stage 4 (severe): Secondary | ICD-10-CM | POA: Diagnosis not present

## 2021-09-12 DIAGNOSIS — Z515 Encounter for palliative care: Secondary | ICD-10-CM | POA: Diagnosis not present

## 2021-09-12 DIAGNOSIS — J449 Chronic obstructive pulmonary disease, unspecified: Secondary | ICD-10-CM | POA: Diagnosis not present

## 2021-10-13 DIAGNOSIS — J449 Chronic obstructive pulmonary disease, unspecified: Secondary | ICD-10-CM | POA: Diagnosis not present

## 2021-10-13 DIAGNOSIS — Z515 Encounter for palliative care: Secondary | ICD-10-CM | POA: Diagnosis not present

## 2021-10-13 DIAGNOSIS — F329 Major depressive disorder, single episode, unspecified: Secondary | ICD-10-CM | POA: Diagnosis not present

## 2021-10-13 DIAGNOSIS — N184 Chronic kidney disease, stage 4 (severe): Secondary | ICD-10-CM | POA: Diagnosis not present

## 2021-12-12 DIAGNOSIS — J449 Chronic obstructive pulmonary disease, unspecified: Secondary | ICD-10-CM | POA: Diagnosis not present

## 2021-12-12 DIAGNOSIS — N184 Chronic kidney disease, stage 4 (severe): Secondary | ICD-10-CM | POA: Diagnosis not present

## 2021-12-12 DIAGNOSIS — Z515 Encounter for palliative care: Secondary | ICD-10-CM | POA: Diagnosis not present

## 2021-12-12 DIAGNOSIS — F329 Major depressive disorder, single episode, unspecified: Secondary | ICD-10-CM | POA: Diagnosis not present

## 2021-12-14 DIAGNOSIS — R300 Dysuria: Secondary | ICD-10-CM | POA: Diagnosis not present

## 2021-12-14 DIAGNOSIS — F17210 Nicotine dependence, cigarettes, uncomplicated: Secondary | ICD-10-CM | POA: Diagnosis not present

## 2021-12-26 DIAGNOSIS — Z23 Encounter for immunization: Secondary | ICD-10-CM | POA: Diagnosis not present

## 2021-12-26 DIAGNOSIS — R531 Weakness: Secondary | ICD-10-CM | POA: Diagnosis not present

## 2021-12-26 DIAGNOSIS — E43 Unspecified severe protein-calorie malnutrition: Secondary | ICD-10-CM | POA: Diagnosis not present

## 2021-12-26 DIAGNOSIS — R5381 Other malaise: Secondary | ICD-10-CM | POA: Diagnosis not present

## 2021-12-26 DIAGNOSIS — J9811 Atelectasis: Secondary | ICD-10-CM | POA: Diagnosis not present

## 2021-12-26 DIAGNOSIS — R2689 Other abnormalities of gait and mobility: Secondary | ICD-10-CM | POA: Diagnosis not present

## 2021-12-26 DIAGNOSIS — I1310 Hypertensive heart and chronic kidney disease without heart failure, with stage 1 through stage 4 chronic kidney disease, or unspecified chronic kidney disease: Secondary | ICD-10-CM | POA: Diagnosis not present

## 2021-12-26 DIAGNOSIS — E1122 Type 2 diabetes mellitus with diabetic chronic kidney disease: Secondary | ICD-10-CM | POA: Diagnosis not present

## 2021-12-26 DIAGNOSIS — N3001 Acute cystitis with hematuria: Secondary | ICD-10-CM | POA: Diagnosis not present

## 2021-12-26 DIAGNOSIS — J439 Emphysema, unspecified: Secondary | ICD-10-CM | POA: Diagnosis not present

## 2021-12-26 DIAGNOSIS — E785 Hyperlipidemia, unspecified: Secondary | ICD-10-CM | POA: Diagnosis not present

## 2021-12-26 DIAGNOSIS — N184 Chronic kidney disease, stage 4 (severe): Secondary | ICD-10-CM | POA: Diagnosis not present

## 2021-12-26 DIAGNOSIS — E1151 Type 2 diabetes mellitus with diabetic peripheral angiopathy without gangrene: Secondary | ICD-10-CM | POA: Diagnosis not present

## 2021-12-26 DIAGNOSIS — R4182 Altered mental status, unspecified: Secondary | ICD-10-CM | POA: Diagnosis not present

## 2021-12-26 DIAGNOSIS — H9190 Unspecified hearing loss, unspecified ear: Secondary | ICD-10-CM | POA: Diagnosis not present

## 2021-12-26 DIAGNOSIS — J189 Pneumonia, unspecified organism: Secondary | ICD-10-CM | POA: Diagnosis not present

## 2021-12-26 DIAGNOSIS — J90 Pleural effusion, not elsewhere classified: Secondary | ICD-10-CM | POA: Diagnosis not present

## 2021-12-26 DIAGNOSIS — I6381 Other cerebral infarction due to occlusion or stenosis of small artery: Secondary | ICD-10-CM | POA: Diagnosis not present

## 2021-12-26 DIAGNOSIS — I739 Peripheral vascular disease, unspecified: Secondary | ICD-10-CM | POA: Diagnosis not present

## 2021-12-26 DIAGNOSIS — Z681 Body mass index (BMI) 19 or less, adult: Secondary | ICD-10-CM | POA: Diagnosis not present

## 2021-12-26 DIAGNOSIS — E860 Dehydration: Secondary | ICD-10-CM | POA: Diagnosis not present

## 2021-12-26 DIAGNOSIS — I959 Hypotension, unspecified: Secondary | ICD-10-CM | POA: Diagnosis not present

## 2021-12-26 DIAGNOSIS — G9341 Metabolic encephalopathy: Secondary | ICD-10-CM | POA: Diagnosis not present

## 2021-12-29 DIAGNOSIS — N184 Chronic kidney disease, stage 4 (severe): Secondary | ICD-10-CM | POA: Diagnosis not present

## 2021-12-29 DIAGNOSIS — I129 Hypertensive chronic kidney disease with stage 1 through stage 4 chronic kidney disease, or unspecified chronic kidney disease: Secondary | ICD-10-CM | POA: Diagnosis not present

## 2021-12-29 DIAGNOSIS — N390 Urinary tract infection, site not specified: Secondary | ICD-10-CM | POA: Diagnosis not present

## 2021-12-29 DIAGNOSIS — R339 Retention of urine, unspecified: Secondary | ICD-10-CM | POA: Diagnosis not present

## 2022-05-03 DEATH — deceased
